# Patient Record
Sex: Male | Born: 1985
Health system: Southern US, Community
[De-identification: ages and names within clinical notes are randomized; demographics above are authoritative.]

## PROBLEM LIST (undated history)

## (undated) DIAGNOSIS — K649 Unspecified hemorrhoids: Secondary | ICD-10-CM

## (undated) DIAGNOSIS — K644 Residual hemorrhoidal skin tags: Secondary | ICD-10-CM

## (undated) DIAGNOSIS — Z8673 Personal history of transient ischemic attack (TIA), and cerebral infarction without residual deficits: Secondary | ICD-10-CM

## (undated) DIAGNOSIS — L659 Nonscarring hair loss, unspecified: Secondary | ICD-10-CM

## (undated) DIAGNOSIS — I10 Essential (primary) hypertension: Secondary | ICD-10-CM

## (undated) DIAGNOSIS — Z973 Presence of spectacles and contact lenses: Secondary | ICD-10-CM

## (undated) DIAGNOSIS — K219 Gastro-esophageal reflux disease without esophagitis: Secondary | ICD-10-CM

## (undated) HISTORY — PX: NO PAST SURGERIES: SHX2092

## (undated) HISTORY — DX: Essential (primary) hypertension: I10

## (undated) HISTORY — PX: HEMORROIDECTOMY: SUR656

## (undated) HISTORY — DX: Gastro-esophageal reflux disease without esophagitis: K21.9

---

## 2011-07-31 DIAGNOSIS — Z8673 Personal history of transient ischemic attack (TIA), and cerebral infarction without residual deficits: Secondary | ICD-10-CM

## 2011-07-31 HISTORY — DX: Personal history of transient ischemic attack (TIA), and cerebral infarction without residual deficits: Z86.73

## 2015-02-21 ENCOUNTER — Emergency Department (HOSPITAL_COMMUNITY)
Admission: EM | Admit: 2015-02-21 | Discharge: 2015-02-21 | Disposition: A | Discharge status: Home / Self Care | Payer: Self-pay | Attending: Emergency Medicine | Admitting: Emergency Medicine

## 2015-02-21 ENCOUNTER — Encounter (HOSPITAL_COMMUNITY): Payer: Self-pay

## 2015-02-21 DIAGNOSIS — Z8673 Personal history of transient ischemic attack (TIA), and cerebral infarction without residual deficits: Secondary | ICD-10-CM | POA: Insufficient documentation

## 2015-02-21 DIAGNOSIS — R198 Other specified symptoms and signs involving the digestive system and abdomen: Secondary | ICD-10-CM

## 2015-02-21 DIAGNOSIS — K6289 Other specified diseases of anus and rectum: Secondary | ICD-10-CM | POA: Insufficient documentation

## 2015-02-21 MED ORDER — CIPROFLOXACIN HCL 500 MG PO TABS
500.0000 mg | ORAL_TABLET | Freq: Once | ORAL | Status: AC
  Administered 2015-02-21: 500 mg via ORAL
  Filled 2015-02-21: qty 1

## 2015-02-21 MED ORDER — CIPROFLOXACIN HCL 500 MG PO TABS: 500.0000 mg | ORAL_TABLET | Freq: Two times a day (BID) | ORAL | Status: DC

## 2015-02-21 NOTE — ED Notes (Signed)
MD at bedside. 

## 2015-02-21 NOTE — ED Provider Notes (Signed)
CSN: [id number]     Arrival date & time 02/21/15  1133 History   First MD Initiated Contact with Patient 02/21/15 1208     Chief Complaint  Patient presents with  . Fever  . Cough  . Rectal Discharge      HPI Patient reports scant rectal discharge of the past 3-4 days.  He feels like this is somewhat improving.  He was seen [facility] the [facility] reports he had a full STD check including rectal swabs was told he had no HIV or syphilis.  He feels like his discharge is improving but has had low-grade fevers of the past several days.  Had a prostate infection his been similar to this before in the past.  He denies urinary symptoms at this time.  Denies nausea vomiting.  No abdominal pain.  No other complaints.  He does have rectal intercourse   Past Medical History  Diagnosis Date  . TIA (transient ischemic attack)   . Stroke    History reviewed. No pertinent past surgical history. History reviewed. No pertinent family history. History  Substance Use Topics  . Smoking status: Never Smoker   . Smokeless tobacco: Not on file  . Alcohol Use: Yes     Comment: rarely    Review of Systems  All other systems reviewed and are negative.     Allergies  Review of patient's allergies indicates not on file.  Home Medications   Prior to Admission medications   Medication Sig Start Date End Date Taking? Authorizing Provider  acetaminophen (TYLENOL) 500 MG tablet Take 500 mg by mouth every 6 (six) hours as needed for fever.   Yes Historical Provider, MD   BP 137/77 mmHg  Pulse 89  Temp(Src) 98.6 F (37 C) (Oral)  Resp 18  SpO2 99% Physical Exam  Constitutional: He is oriented to person, place, and time. He appears well-developed and well-nourished.  HENT:  Head: Normocephalic and atraumatic.  Eyes: EOM are normal.  Neck: Normal range of motion.  Cardiovascular: Normal rate, regular rhythm, normal heart sounds and intact distal pulses.   Pulmonary/Chest: Effort normal and  breath sounds normal. No respiratory distress.  Abdominal: Soft. He exhibits no distension. There is no tenderness.  Genitourinary:  Rectal examination demonstrates no gross blood.  No obvious rectal fissures.  Small amount of irritation consistent with dermatitis around his anus.  Mild tenderness of the prostate.  Musculoskeletal: Normal range of motion.  Neurological: He is alert and oriented to person, place, and time.  Skin: Skin is warm and dry.  Psychiatric: He has a normal mood and affect. Judgment normal.  Nursing note and vitals reviewed.   ED Course  Procedures (including critical care time) Labs Review Labs Reviewed - No data to display  Imaging Review No results found.   EKG Interpretation None      MDM   Final diagnoses:  None    This time the patient has only mild tenderness on rectal examination.  Patient be given ciprofloxacin at this time.  Spiriva present early prostatitis versus proctitis.  I'm not convinced the patient has a rectal abscess at this time.  Patient is overall well-appearing.  He is afebrile and nontoxic here.  His abdominal exam is benign.  Recommend outpatient follow-up with a gastroenterologist.  He will go home on Cipro.  He understands to return to the ER for new or worsening symptoms    Azalia Bilis, MD 02/21/15 1312

## 2015-02-21 NOTE — ED Notes (Signed)
Pt c/o rectal discharge and pain x 2.5 weeks and fever and cough x 2 weeks.  Pain only w/ bowel movements.  Discharge is clear and sometimes tainted w/ blood.  Pt reports taking Tylenol for fever.  Sts "[facility] and had a full workup, but was told that they could not give him any medications."

## 2016-11-16 ENCOUNTER — Encounter: Payer: Self-pay | Admitting: Family

## 2016-11-16 ENCOUNTER — Other Ambulatory Visit (INDEPENDENT_AMBULATORY_CARE_PROVIDER_SITE_OTHER): Payer: BLUE CROSS/BLUE SHIELD

## 2016-11-16 ENCOUNTER — Ambulatory Visit (INDEPENDENT_AMBULATORY_CARE_PROVIDER_SITE_OTHER): Payer: BLUE CROSS/BLUE SHIELD | Admitting: Family

## 2016-11-16 VITALS — BP 142/94 | HR 80 | Temp 99.1°F | Resp 16 | Ht 69.0 in | Wt 184.1 lb

## 2016-11-16 DIAGNOSIS — L659 Nonscarring hair loss, unspecified: Secondary | ICD-10-CM

## 2016-11-16 DIAGNOSIS — K644 Residual hemorrhoidal skin tags: Secondary | ICD-10-CM

## 2016-11-16 DIAGNOSIS — K219 Gastro-esophageal reflux disease without esophagitis: Secondary | ICD-10-CM | POA: Diagnosis not present

## 2016-11-16 LAB — COMPREHENSIVE METABOLIC PANEL
ALT: 17 U/L (ref 0–53)
AST: 20 U/L (ref 0–37)
Albumin: 4.7 g/dL (ref 3.5–5.2)
Alkaline Phosphatase: 65 U/L (ref 39–117)
BUN: 15 mg/dL (ref 6–23)
CO2: 29 mEq/L (ref 19–32)
Calcium: 9.5 mg/dL (ref 8.4–10.5)
Chloride: 101 mEq/L (ref 96–112)
Creatinine, Ser: 1.17 mg/dL (ref 0.40–1.50)
GFR: 77.87 mL/min (ref >60.00)
Glucose, Bld: 84 mg/dL (ref 70–99)
Potassium: 3.9 mEq/L (ref 3.5–5.1)
Sodium: 138 mEq/L (ref 135–145)
Total Bilirubin: 0.5 mg/dL (ref 0.2–1.2)
Total Protein: 7.4 g/dL (ref 6.0–8.3)

## 2016-11-16 LAB — TSH: TSH: 1.52 u[IU]/mL (ref 0.35–4.50)

## 2016-11-16 NOTE — Assessment & Plan Note (Signed)
Obtain complete metabolic profile and TSH. Has significant family history for hair loss in first and second-degree relatives. Will consider fat finasteride pending blood work results. Advised to continue with high protein intake and biotin supplementation as needed.

## 2016-11-16 NOTE — Assessment & Plan Note (Signed)
Residual hemorrhoidal skin tag located at the 9/10:00 position of the rectum with no evidence of thrombosis. Refer to general surgery per patient request to have it removed. Continue with good rectal hygiene and avoiding constipation. Continue over-the-counter medications as needed for symptom relief. Continue to monitor.

## 2016-11-16 NOTE — Patient Instructions (Addendum)
Thank you for choosing [facility].  SUMMARY AND INSTRUCTIONS:  Please monitor your blood pressure at home.  We will check your blood work to ensure there is not a metabolic cause for your hair loss.   Continue with good rectal hygiene and avoid constipation.   They will call with your referral to general surgery.  Labs:  Please stop by the lab on the lower level of the building for your blood work. Your results will be released to MyChart (or called to you) after review, usually within 72 hours after test completion. If any changes need to be made, you will be notified at that same time.  1.) The lab is open from 7:30am to 5:30 pm Monday-Friday 2.) No appointment is necessary 3.) Fasting (if needed) is 6-8 hours after food and drink; black coffee and water are okay    Referrals:  Referrals have been made during this visit. You should expect to hear back from our schedulers in about 7-10 days in regards to establishing an appointment with the specialists we discussed.   Follow up:  If your symptoms worsen or fail to improve, please contact our office for further instruction, or in case of emergency go directly to the emergency room at the closest medical facility.     Food Choices for Gastroesophageal Reflux Disease, Adult When you have gastroesophageal reflux disease (GERD), the foods you eat and your eating habits are very important. Choosing the right foods can help ease your discomfort. What guidelines do I need to follow?  Choose fruits, vegetables, whole grains, and low-fat dairy products.  Choose low-fat meat, fish, and poultry.  Limit fats such as oils, salad dressings, butter, nuts, and avocado.  Keep a food diary. This helps you identify foods that cause symptoms.  Avoid foods that cause symptoms. These may be different for everyone.  Eat small meals often instead of 3 large meals a day.  Eat your meals slowly, in a place where you are relaxed.  Limit  fried foods.  Cook foods using methods other than frying.  Avoid drinking alcohol.  Avoid drinking large amounts of liquids with your meals.  Avoid bending over or lying down until 2-3 hours after eating. What foods are not recommended? These are some foods and drinks that may make your symptoms worse: Vegetables  Tomatoes. Tomato juice. Tomato and spaghetti sauce. Chili peppers. Onion and garlic. Horseradish. Fruits  Oranges, grapefruit, and lemon (fruit and juice). Meats  High-fat meats, fish, and poultry. This includes hot dogs, ribs, ham, sausage, salami, and bacon. Dairy  Whole milk and chocolate milk. Sour cream. Cream. Butter. Ice cream. Cream cheese. Drinks  Coffee and tea. Bubbly (carbonated) drinks or energy drinks. Condiments  Hot sauce. Barbecue sauce. Sweets/Desserts  Chocolate and cocoa. Donuts. Peppermint and spearmint. Fats and Oils  High-fat foods. This includes [place] fries and potato chips. Other  Vinegar. Strong spices. This includes black pepper, white pepper, red pepper, cayenne, curry powder, cloves, ginger, and chili powder. The items listed above may not be a complete list of foods and drinks to avoid. Contact your dietitian for more information.  This information is not intended to replace advice given to you by your health care provider. Make sure you discuss any questions you have with your health care provider. Document Released: 01/15/2012 Document Revised: 12/22/2015 Document Reviewed: 05/20/2013 Elsevier Interactive Patient Education  2017 [employer].

## 2016-11-16 NOTE — Assessment & Plan Note (Signed)
Symptoms and exam are consistent with gastroesophageal reflux most likely associated with nutritional intake. Medications as needed for symptom relief and supportive care. Information on GERD-related diet provided and after visit summary. Follow-up if symptoms worsen or no longer controlled with over-the-counter medications.

## 2016-11-16 NOTE — Progress Notes (Signed)
Subjective:    Patient ID: Justin Burch, male    DOB: [birth year], 32 y.o.   MRN: [id number]  Chief Complaint  Patient presents with  . Establish Care    wants to talk about getting a rx for finasteride, heartburn and hemerroids    HPI:  Justin Burch is a 31 y.o. male who  has a past medical history of Chicken pox; GERD (gastroesophageal reflux disease); Stroke [facility]); and TIA (transient ischemic attack). and presents today for an office visit to establish care.  1.) GERD - Associated symptom of reflux has been going on for about 4 months and the timing of the symptoms is worse before he goes to bed. He has tried over the counter Prilosec which helped out significantly. Prior to the start of these episodes he had not had any symptoms. Denies any nausea, vomiting, diarrhea, or constipation. Does take ibuprofen about 2-3 times per month for headaches, but not regularly. He is supposed to take aspirin on a regular basis. Working on nutrition and physical activity. No previous medications attempted.   2.) Hemorrhoids - Associated symptom of a hemorrhoid has been going on for about 6 months.  Modifying factors include taking daily fiber supplements. At first there was some bleeding, but has not had symptoms in a while. Describes having a skin tag like feeling.  3.) Hair loss - Associated symptom of hair loss has been going on for about 5 years. Hair loss is gradual with a receeding hair line. Denies any modifying factors or attempted treatments. No work up has been done to this point. No urinary systems currently.  Does have strong family history with grandfather being bald and father have some hair with significant hair loss.   No Known Allergies    Outpatient Medications Prior to Visit  Medication Sig Dispense Refill  . acetaminophen (TYLENOL) 500 MG tablet Take 500 mg by mouth every 6 (six) hours as needed for fever.    . ciprofloxacin (CIPRO) 500 MG tablet Take 1 tablet (500 mg total)  by mouth every 12 (twelve) hours. 14 tablet 0   No facility-administered medications prior to visit.       History reviewed. No pertinent surgical history.    Past Medical History:  Diagnosis Date  . Chicken pox   . GERD (gastroesophageal reflux disease)   . Stroke ([place])   . TIA (transient ischemic attack)       Review of Systems  Constitutional: Negative for chills, fatigue and fever.  Respiratory: Negative for chest tightness and shortness of breath.   Cardiovascular: Negative for chest pain, palpitations and leg swelling.  Endocrine: Negative for cold intolerance, heat intolerance, polydipsia, polyphagia and polyuria.  Skin:       Positive for hair loss.       Objective:    BP (!) 142/94 (BP Location: Left Arm, Patient Position: Sitting, Cuff Size: Normal)   Pulse 80   Temp 99.1 F (37.3 C) (Oral)   Resp 16   Ht _0  (1.753 m)   Wt 184 lb 1.9 oz (83.5 kg)   SpO2 97%   BMI 27.19 kg/m  Nursing note and vital signs reviewed.  Physical Exam  Constitutional: He is oriented to person, place, and time. He appears well-developed and well-nourished. No distress.  Cardiovascular: Normal rate, regular rhythm, normal heart sounds and intact distal pulses.   Pulmonary/Chest: Effort normal and breath sounds normal.  Abdominal: Normal appearance and bowel sounds are normal. He exhibits no mass. There  is no hepatosplenomegaly. There is no tenderness. There is no rigidity, no rebound, no guarding, no tenderness at McBurney's point and negative Murphy's sign.  Genitourinary: Rectal exam shows no fissure, no mass and no tenderness.  Genitourinary Comments: External skin tag/former hemorrhoid located at the 9/10:00 position of the rectum.  Neurological: He is alert and oriented to person, place, and time.  Skin: Skin is warm and dry.  Psychiatric: He has a normal mood and affect. His behavior is normal. Judgment and thought content normal.       Assessment & Plan:   Problem  List Items Addressed This Visit      Digestive   Residual hemorrhoidal skin tags - Primary    Residual hemorrhoidal skin tag located at the 9/10:00 position of the rectum with no evidence of thrombosis. Refer to general surgery per patient request to have it removed. Continue with good rectal hygiene and avoiding constipation. Continue over-the-counter medications as needed for symptom relief. Continue to monitor.      Relevant Orders   Ambulatory referral to General Surgery   Gastroesophageal reflux disease without esophagitis    Symptoms and exam are consistent with gastroesophageal reflux most likely associated with nutritional intake. Medications as needed for symptom relief and supportive care. Information on GERD-related diet provided and after visit summary. Follow-up if symptoms worsen or no longer controlled with over-the-counter medications.        Other   Hair loss    Obtain complete metabolic profile and TSH. Has significant family history for hair loss in first and second-degree relatives. Will consider fat finasteride pending blood work results. Advised to continue with high protein intake and biotin supplementation as needed.      Relevant Orders   Comp Met (CMET)   TSH       I have discontinued [name] acetaminophen and ciprofloxacin.   Follow-up: Return in about 3 months (around 02/15/2017), or if symptoms worsen or fail to improve.  Mauricio Po, FNP

## 2016-11-18 MED ORDER — FINASTERIDE 1 MG PO TABS: 1.0000 mg | ORAL_TABLET | Freq: Every day | ORAL | 3 refills | Status: DC

## 2016-12-07 ENCOUNTER — Ambulatory Visit: Payer: Self-pay | Admitting: Surgery

## 2016-12-07 DIAGNOSIS — K644 Residual hemorrhoidal skin tags: Secondary | ICD-10-CM | POA: Diagnosis not present

## 2016-12-07 NOTE — H&P (Signed)
Surgical H&P  CC: anal skin tag  HPI: this is a 31 year old gentleman who presents with a anal skin tag.  He develot episode of hemorrhoid disease about 6 months ago.  At that time he had about a week of pain which subsequently subsided.  He has not had any bleeding.  No pruritus or pain presently.  Since that event he continues to notice the skin tag in the perianal region which is irritating to him.  He desires removal.  No prior rectal or perianal procedures.  No prior colonoscopy.  He does have rectal intercourse per chart review. On chart review it appears as though he's had some episodes of prostatitis. He does not have any issues with constipation.  He takes a fiber supplement daily.  Mild amount of straining but does not spend longer than 10 minutes on the toilet.   The TIA noted in his history occurred about 6 years ago and has never recurred since.  There was no etiology discovered.  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  No Known Allergies  Past Medical History:  Diagnosis Date  . Chicken pox   . GERD (gastroesophageal reflux disease)   . Stroke ([facility])   . TIA (transient ischemic attack)     No past surgical history on file.  Family History  Problem Relation Age of Onset  . Hypertension Mother   . Stroke Mother   . Ovarian cancer Mother   . Kidney disease Mother   . Healthy Father   . Lung cancer Maternal Grandmother   . Healthy Paternal Grandmother   . Colon cancer Paternal Grandfather     Social History   Social History  . Marital status: Single    Spouse name: N/A  . Number of children: 0  . Years of education: 12   Occupational History  . Hospitality     Social History Main Topics  . Smoking status: Never Smoker  . Smokeless tobacco: Never Used  . Alcohol use Yes     Comment: 1-2 times per month  . Drug use: Yes    Types: Marijuana     Comment: 3x per year  . Sexual activity: Not on file   Other Topics Concern  .  Not on file   Social History Narrative   Fun/Hobby: Video games, read    Current Outpatient Prescriptions on File Prior to Visit  Medication Sig Dispense Refill  . finasteride (PROPECIA) 1 MG tablet Take 1 tablet (1 mg total) by mouth daily. 30 tablet 3   No current facility-administered medications on file prior to visit.     Review of Systems: a complete, 10pt review of systems was completed with pertinent positives and negatives as documented in the HPI.   Physical Exam: There were no vitals filed for this visit. Gen: A&Ox3, no distress  Head: normocephalic, atraumatic, EOMI, anicteric.  Neck: supple without mass or thyromegaly Chest: unlabored respirations symmetrical air entry  Cardiovascular: RRR with palpable distal pulses Abdomen: soft, nontender nondistended.  No mass or organomegaly Extremities: warm, without edema, no deformities  Neuro: grossly intact Psych: appropriate mood and affect, normal insight Skin: no lesions or rashes on limited skin exam Rectum: there is a 1 severe by 5 mm pedunculated skin tag in the left posterior position.  Just anterior to this there is a very small additional skin tag.  No external hemorrhoidal disease otherwise.  On digital exam there is no mass or  blood.  No tenderness.  No flowsheet data found.  CMP Latest Ref Rng & Units 11/16/2016  Glucose 70 - 99 mg/dL 84  BUN 6 - 23 mg/dL 15  Creatinine 8.46 - 9.62 mg/dL 9.52  Sodium 841 - 324 mEq/L 138  Potassium 3.5 - 5.1 mEq/L 3.9  Chloride 96 - 112 mEq/L 101  CO2 19 - 32 mEq/L 29  Calcium 8.4 - 10.5 mg/dL 9.5  Total Protein 6.0 - 8.3 g/dL 7.4  Total Bilirubin 0.2 - 1.2 mg/dL 0.5  Alkaline Phos 39 - 117 U/L 65  AST 0 - 37 U/L 20  ALT 0 - 53 U/L 17    No results found for: INR, PROTIME  Imaging: n/a  A/P: 31 year old gentleman with a symptomatic anal skin tag.  He desires excision.  I discussed with him the nature of the surgery including the risks of bleeding, infection, pain,  scarring, very unlikely sphincter injury incontinence, urinary retention, and recurrence of hemorrhoidal disease. I advised him S of the importance of good bowel regimen including drinking 64 ounces of water a day in addition to the fibers along that he is already taking.  With the goal of having 1 soft easy bowel movement daily without straining and leinutes on the toilet.  We discussed the need to do sitz baths.  Discussed the anticipated postop recovery time and restrictions.   Phylliss Blakes, MD [facility], [place] Pager [phone]

## 2017-01-10 ENCOUNTER — Encounter (HOSPITAL_BASED_OUTPATIENT_CLINIC_OR_DEPARTMENT_OTHER): Payer: Self-pay | Admitting: *Deleted

## 2017-01-10 NOTE — Progress Notes (Signed)
NPO AFTER MN.  ARRIVE AT 0930.  NEEDS HG.  WILL TAKE PRILOSEC AM DOS W/ SIPS OF WATER.

## 2017-01-18 ENCOUNTER — Ambulatory Visit (HOSPITAL_BASED_OUTPATIENT_CLINIC_OR_DEPARTMENT_OTHER): Payer: BLUE CROSS/BLUE SHIELD | Admitting: Anesthesiology

## 2017-01-18 ENCOUNTER — Encounter (HOSPITAL_BASED_OUTPATIENT_CLINIC_OR_DEPARTMENT_OTHER)
Admission: RE | Disposition: A | Discharge status: Home / Self Care | Payer: Self-pay | Source: Ambulatory Visit | Attending: Surgery

## 2017-01-18 ENCOUNTER — Ambulatory Visit (HOSPITAL_BASED_OUTPATIENT_CLINIC_OR_DEPARTMENT_OTHER)
Admission: RE | Admit: 2017-01-18 | Discharge: 2017-01-18 | Disposition: A | Discharge status: Home / Self Care | Payer: BLUE CROSS/BLUE SHIELD | Source: Ambulatory Visit | Attending: Surgery | Admitting: Surgery

## 2017-01-18 ENCOUNTER — Encounter (HOSPITAL_BASED_OUTPATIENT_CLINIC_OR_DEPARTMENT_OTHER): Payer: Self-pay

## 2017-01-18 DIAGNOSIS — K219 Gastro-esophageal reflux disease without esophagitis: Secondary | ICD-10-CM | POA: Insufficient documentation

## 2017-01-18 DIAGNOSIS — Z801 Family history of malignant neoplasm of trachea, bronchus and lung: Secondary | ICD-10-CM | POA: Diagnosis not present

## 2017-01-18 DIAGNOSIS — Z800 Family history of malignant neoplasm of digestive organs: Secondary | ICD-10-CM | POA: Insufficient documentation

## 2017-01-18 DIAGNOSIS — Z8041 Family history of malignant neoplasm of ovary: Secondary | ICD-10-CM | POA: Diagnosis not present

## 2017-01-18 DIAGNOSIS — Z8673 Personal history of transient ischemic attack (TIA), and cerebral infarction without residual deficits: Secondary | ICD-10-CM | POA: Insufficient documentation

## 2017-01-18 DIAGNOSIS — Z823 Family history of stroke: Secondary | ICD-10-CM | POA: Insufficient documentation

## 2017-01-18 DIAGNOSIS — K644 Residual hemorrhoidal skin tags: Secondary | ICD-10-CM | POA: Diagnosis not present

## 2017-01-18 DIAGNOSIS — Z8249 Family history of ischemic heart disease and other diseases of the circulatory system: Secondary | ICD-10-CM | POA: Insufficient documentation

## 2017-01-18 DIAGNOSIS — Z79899 Other long term (current) drug therapy: Secondary | ICD-10-CM | POA: Diagnosis not present

## 2017-01-18 DIAGNOSIS — K210 Gastro-esophageal reflux disease with esophagitis: Secondary | ICD-10-CM | POA: Diagnosis not present

## 2017-01-18 HISTORY — PX: EXCISION OF SKIN TAG: SHX6270

## 2017-01-18 HISTORY — DX: Residual hemorrhoidal skin tags: K64.4

## 2017-01-18 HISTORY — DX: Presence of spectacles and contact lenses: Z97.3

## 2017-01-18 HISTORY — DX: Nonscarring hair loss, unspecified: L65.9

## 2017-01-18 HISTORY — DX: Unspecified hemorrhoids: K64.9

## 2017-01-18 HISTORY — DX: Personal history of transient ischemic attack (TIA), and cerebral infarction without residual deficits: Z86.73

## 2017-01-18 LAB — POCT HEMOGLOBIN-HEMACUE: Hemoglobin: 16.2 g/dL (ref 13.0–17.0)

## 2017-01-18 SURGERY — EXCISION, SKIN TAG
Anesthesia: Monitor Anesthesia Care | Site: Rectum

## 2017-01-18 MED ORDER — MEPERIDINE HCL 25 MG/ML IJ SOLN
6.2500 mg | INTRAMUSCULAR | Status: DC | PRN
  Filled 2017-01-18: qty 1

## 2017-01-18 MED ORDER — PROPOFOL 10 MG/ML IV BOLUS
INTRAVENOUS | Status: DC | PRN
Start: 2017-01-18 — End: 2017-01-18
  Administered 2017-01-18: 50 mg via INTRAVENOUS

## 2017-01-18 MED ORDER — ONDANSETRON HCL 4 MG/2ML IJ SOLN
INTRAMUSCULAR | Status: AC
  Filled 2017-01-18: qty 2

## 2017-01-18 MED ORDER — FENTANYL CITRATE (PF) 100 MCG/2ML IJ SOLN
INTRAMUSCULAR | Status: AC
  Filled 2017-01-18: qty 2

## 2017-01-18 MED ORDER — MIDAZOLAM HCL 5 MG/5ML IJ SOLN
INTRAMUSCULAR | Status: DC | PRN
  Administered 2017-01-18: 1 mg via INTRAVENOUS
  Administered 2017-01-18: 2 mg via INTRAVENOUS

## 2017-01-18 MED ORDER — CELECOXIB 400 MG PO CAPS
400.0000 mg | ORAL_CAPSULE | ORAL | Status: AC
  Administered 2017-01-18: 400 mg via ORAL
  Filled 2017-01-18: qty 1

## 2017-01-18 MED ORDER — ACETAMINOPHEN 650 MG RE SUPP
650.0000 mg | RECTAL | Status: DC | PRN
  Filled 2017-01-18: qty 1

## 2017-01-18 MED ORDER — FLEET ENEMA 7-19 GM/118ML RE ENEM
1.0000 | ENEMA | Freq: Once | RECTAL | Status: AC
  Administered 2017-01-18: 1 via RECTAL
  Filled 2017-01-18: qty 1

## 2017-01-18 MED ORDER — GABAPENTIN 300 MG PO CAPS
300.0000 mg | ORAL_CAPSULE | ORAL | Status: AC
  Administered 2017-01-18: 300 mg via ORAL
  Filled 2017-01-18: qty 1

## 2017-01-18 MED ORDER — ACETAMINOPHEN 500 MG PO TABS
1000.0000 mg | ORAL_TABLET | ORAL | Status: AC
  Administered 2017-01-18: 1000 mg via ORAL
  Filled 2017-01-18: qty 2

## 2017-01-18 MED ORDER — CEFAZOLIN SODIUM-DEXTROSE 2-4 GM/100ML-% IV SOLN
2.0000 g | INTRAVENOUS | Status: AC
  Administered 2017-01-18: 2 g via INTRAVENOUS
  Filled 2017-01-18: qty 100

## 2017-01-18 MED ORDER — SODIUM CHLORIDE 0.9% FLUSH
3.0000 mL | INTRAVENOUS | Status: DC | PRN
  Filled 2017-01-18: qty 3

## 2017-01-18 MED ORDER — ONDANSETRON HCL 4 MG/2ML IJ SOLN
INTRAMUSCULAR | Status: DC | PRN
  Administered 2017-01-18: 4 mg via INTRAVENOUS

## 2017-01-18 MED ORDER — CHLORHEXIDINE GLUCONATE 4 % EX LIQD
60.0000 mL | Freq: Once | CUTANEOUS | Status: DC
  Filled 2017-01-18: qty 118

## 2017-01-18 MED ORDER — MIDAZOLAM HCL 2 MG/2ML IJ SOLN
INTRAMUSCULAR | Status: AC
  Filled 2017-01-18: qty 2

## 2017-01-18 MED ORDER — SODIUM CHLORIDE 0.9% FLUSH
3.0000 mL | Freq: Two times a day (BID) | INTRAVENOUS | Status: DC
  Filled 2017-01-18: qty 3

## 2017-01-18 MED ORDER — PROMETHAZINE HCL 25 MG/ML IJ SOLN
6.2500 mg | INTRAMUSCULAR | Status: DC | PRN
  Filled 2017-01-18: qty 1

## 2017-01-18 MED ORDER — LIDOCAINE HCL (CARDIAC) 20 MG/ML IV SOLN
INTRAVENOUS | Status: DC | PRN
  Administered 2017-01-18: 60 mg via INTRAVENOUS

## 2017-01-18 MED ORDER — DEXAMETHASONE SODIUM PHOSPHATE 4 MG/ML IJ SOLN
INTRAMUSCULAR | Status: DC | PRN
  Administered 2017-01-18: 10 mg via INTRAVENOUS

## 2017-01-18 MED ORDER — OXYCODONE HCL 5 MG PO TABS
5.0000 mg | ORAL_TABLET | ORAL | Status: DC | PRN
  Filled 2017-01-18: qty 2

## 2017-01-18 MED ORDER — LIDOCAINE 2% (20 MG/ML) 5 ML SYRINGE
INTRAMUSCULAR | Status: AC
  Filled 2017-01-18: qty 5

## 2017-01-18 MED ORDER — PROPOFOL 10 MG/ML IV BOLUS
INTRAVENOUS | Status: AC
  Filled 2017-01-18: qty 40

## 2017-01-18 MED ORDER — LACTATED RINGERS IV SOLN
INTRAVENOUS | Status: DC
  Administered 2017-01-18: 10:00:00 via INTRAVENOUS
  Filled 2017-01-18: qty 1000

## 2017-01-18 MED ORDER — MIDAZOLAM HCL 2 MG/2ML IJ SOLN
0.5000 mg | Freq: Once | INTRAMUSCULAR | Status: DC | PRN
  Filled 2017-01-18: qty 2

## 2017-01-18 MED ORDER — FENTANYL CITRATE (PF) 100 MCG/2ML IJ SOLN
INTRAMUSCULAR | Status: DC | PRN
  Administered 2017-01-18: 25 ug via INTRAVENOUS

## 2017-01-18 MED ORDER — DOCUSATE SODIUM 100 MG PO CAPS: 100.0000 mg | ORAL_CAPSULE | Freq: Two times a day (BID) | ORAL | 0 refills | Status: AC

## 2017-01-18 MED ORDER — ACETAMINOPHEN 500 MG PO TABS
ORAL_TABLET | ORAL | Status: AC
  Filled 2017-01-18: qty 2

## 2017-01-18 MED ORDER — CELECOXIB 200 MG PO CAPS
ORAL_CAPSULE | ORAL | Status: AC
  Filled 2017-01-18: qty 2

## 2017-01-18 MED ORDER — SODIUM CHLORIDE 0.9 % IV SOLN
250.0000 mL | INTRAVENOUS | Status: DC | PRN
  Filled 2017-01-18: qty 250

## 2017-01-18 MED ORDER — CEFAZOLIN SODIUM-DEXTROSE 2-4 GM/100ML-% IV SOLN
INTRAVENOUS | Status: AC
  Filled 2017-01-18: qty 100

## 2017-01-18 MED ORDER — LIDOCAINE-EPINEPHRINE 1 %-1:100000 IJ SOLN
INTRAMUSCULAR | Status: DC | PRN
  Administered 2017-01-18: 20 mL

## 2017-01-18 MED ORDER — PROPOFOL 500 MG/50ML IV EMUL
INTRAVENOUS | Status: DC | PRN
  Administered 2017-01-18: 150 ug/kg/min via INTRAVENOUS

## 2017-01-18 MED ORDER — FENTANYL CITRATE (PF) 100 MCG/2ML IJ SOLN
25.0000 ug | INTRAMUSCULAR | Status: DC | PRN
  Filled 2017-01-18: qty 1

## 2017-01-18 MED ORDER — HYDROCODONE-ACETAMINOPHEN 5-325 MG PO TABS: 1.0000 | ORAL_TABLET | Freq: Four times a day (QID) | ORAL | 0 refills | Status: DC | PRN

## 2017-01-18 MED ORDER — GABAPENTIN 300 MG PO CAPS
ORAL_CAPSULE | ORAL | Status: AC
  Filled 2017-01-18: qty 1

## 2017-01-18 MED ORDER — ACETAMINOPHEN 325 MG PO TABS
650.0000 mg | ORAL_TABLET | ORAL | Status: DC | PRN
  Filled 2017-01-18: qty 2

## 2017-01-18 MED ORDER — DEXAMETHASONE SODIUM PHOSPHATE 10 MG/ML IJ SOLN
INTRAMUSCULAR | Status: AC
  Filled 2017-01-18: qty 1

## 2017-01-18 SURGICAL SUPPLY — 38 items
BLADE HEX COATED 2.75 (ELECTRODE) IMPLANT
BLADE SURG 15 STRL LF DISP TIS (BLADE) ×1 IMPLANT
BLADE SURG 15 STRL SS (BLADE) ×1
BNDG GAUZE ELAST 4 BULKY (GAUZE/BANDAGES/DRESSINGS) ×2 IMPLANT
BRIEF STRETCH FOR OB PAD LRG (UNDERPADS AND DIAPERS) ×2 IMPLANT
COVER BACK TABLE 60X90IN (DRAPES) ×2 IMPLANT
COVER MAYO STAND STRL (DRAPES) IMPLANT
DECANTER SPIKE VIAL GLASS SM (MISCELLANEOUS) IMPLANT
DRAPE HYSTEROSCOPY (DRAPE) IMPLANT
DRAPE LAPAROTOMY 100X72 PEDS (DRAPES) IMPLANT
DRAPE LG THREE QUARTER DISP (DRAPES) IMPLANT
GAUZE SPONGE 4X4 12PLY STRL (GAUZE/BANDAGES/DRESSINGS) IMPLANT
GAUZE SPONGE 4X4 16PLY XRAY LF (GAUZE/BANDAGES/DRESSINGS) IMPLANT
GLOVE BIO SURGEON STRL SZ 6 (GLOVE) ×4 IMPLANT
GLOVE BIOGEL PI IND STRL 6.5 (GLOVE) ×1 IMPLANT
GLOVE BIOGEL PI IND STRL 7.0 (GLOVE) ×1 IMPLANT
GLOVE BIOGEL PI INDICATOR 6.5 (GLOVE) ×1
GLOVE BIOGEL PI INDICATOR 7.0 (GLOVE) ×1
GLOVE INDICATOR 6.5 STRL GRN (GLOVE) ×4 IMPLANT
GOWN STRL REUS W/ TWL XL LVL3 (GOWN DISPOSABLE) ×2 IMPLANT
GOWN STRL REUS W/TWL 2XL LVL3 (GOWN DISPOSABLE) IMPLANT
GOWN STRL REUS W/TWL LRG LVL3 (GOWN DISPOSABLE) ×2 IMPLANT
GOWN STRL REUS W/TWL XL LVL3 (GOWN DISPOSABLE) ×2
LEGGING LITHOTOMY PAIR STRL (DRAPES) IMPLANT
NEEDLE HYPO 22GX1.5 SAFETY (NEEDLE) ×2 IMPLANT
PACK BASIN DAY SURGERY FS (CUSTOM PROCEDURE TRAY) ×2 IMPLANT
PENCIL BUTTON HOLSTER BLD 10FT (ELECTRODE) ×2 IMPLANT
SHEARS HARMONIC 9CM CVD (BLADE) IMPLANT
SPONGE LAP 18X18 X RAY DECT (DISPOSABLE) IMPLANT
SPONGE SURGIFOAM ABS GEL 100 (HEMOSTASIS) ×2 IMPLANT
SUT CHROMIC 3 0 SH 27 (SUTURE) IMPLANT
SUT VIC AB 3-0 SH 27 (SUTURE)
SUT VIC AB 3-0 SH 27X BRD (SUTURE) IMPLANT
SYR BULB IRRIGATION 50ML (SYRINGE) IMPLANT
SYR CONTROL 10ML LL (SYRINGE) ×2 IMPLANT
TOWEL OR 17X24 6PK STRL BLUE (TOWEL DISPOSABLE) ×2 IMPLANT
TRAY DSU PREP LF (CUSTOM PROCEDURE TRAY) ×2 IMPLANT
YANKAUER SUCT BULB TIP 10FT TU (MISCELLANEOUS) ×2 IMPLANT

## 2017-01-18 NOTE — Discharge Instructions (Signed)
ANORECTAL SURGERY: POST OP INSTRUCTIONS 1. Take your usually prescribed home medications unless otherwise directed. 2. DIET: During the first few hours after surgery sip on some liquids until you are able to urinate.  It is normal to not urinate for several hours after this surgery.  If you feel uncomfortable, please contact the office for instructions.  After you are able to urinate,you may eat, if you feel like it.  Follow a light bland diet the first 24 hours after arrival home, such as soup, liquids, crackers, etc.  Be sure to include lots of fluids daily (6-8 glasses).  Avoid fast food or heavy meals, as your are more likely to get nauseated.  Eat a low fat diet the next few days after surgery.  Limit caffeine intake to 1-2 servings a day. 3. PAIN CONTROL: a. Pain is best controlled by a usual combination of several different methods TOGETHER: i. Muscle relaxation 1.  Soak in a warm bath (or Sitz bath) three times a day and after bowel movements.  Continue to do this until all pain is resolved. ii. Over the counter pain medication iii. Prescription pain medication b. Most patients will experience some swelling and discomfort in the anus/rectal area and incisions.  Heat such as warm towels, sitz baths, warm baths, etc to help relax tight/sore spots and speed recovery.  Some people prefer to use ice, especially in the first couple days after surgery, as it may decrease the pain and swelling, or alternate between ice & heat.  Experiment to what works for you.  Swelling and bruising can take several weeks to resolve.  Pain can take even longer to completely resolve. c. It is helpful to take an over-the-counter pain medication regularly for the first few weeks.  Choose one of the following that works best for you: i. Naproxen (Aleve, etc)  Two 220mg tabs twice a day ii. Ibuprofen (Advil, etc) Three 200mg tabs four times a day (every meal & bedtime) d. A  prescription for pain medication (such as  percocet, oxycodone, hydrocodone, etc) should be given to you upon discharge.  Take your pain medication as prescribed.  i. If you are having problems/concerns with the prescription medicine (does not control pain, nausea, vomiting, rash, itching, etc), please call us [phone] to see if we need to switch you to a different pain medicine that will work better for you and/or control your side effect better. ii. If you need a refill on your pain medication, please contact your pharmacy.  They will contact our office to request authorization. Prescriptions will not be filled after 5 pm or on week-ends. 4. KEEP YOUR BOWELS REGULAR and AVOID CONSTIPATION a. The goal is one to two soft bowel movements a day.  You should at least have a bowel movement every other day. b. Avoid getting constipated.  Between the surgery and the pain medications, it is common to experience some constipation. This can be very painful after rectal surgery.  Increasing fluid intake and taking a fiber supplement (such as Metamucil, Citrucel, FiberCon, etc) 1-2 times a day regularly will usually help prevent this problem from occurring.  A stool softener like colace is also recommended.  This can be purchased over the counter at your pharmacy.  You can take it up to 3 times a day.  If you do not have a bowel movement after 24 hrs since your surgery, take one does of milk of magnesia.  If you still haven't had a bowel movement 8-12   hours after that dose, take another dose.  If you don't have a bowel movement 48 hrs after surgery, purchase a Fleets enema from the drug store and administer gently per package instructions.  If you still are having trouble with your bowel movements after that, please call the office for further instructions. c. If you develop diarrhea or have many loose bowel movements, simplify your diet to bland foods & liquids for a few days.  Stop any stool softeners and decrease your fiber supplement.  Switching to mild  anti-diarrheal medications (Kayopectate, Pepto Bismol) can help.  If this worsens or does not improve, please call us.  5. Wound Care a. Remove your bandages before your first bowel movement or 8 hours after surgery.     b. Remove any wound packing material at this tim,e as well.  You do not need to repack the wound unless instructed otherwise.  Wear an absorbent pad or soft cotton gauze in your underwear to catch any drainage and help keep the area clean. You should change this every 2-3 hours while awake. c. Keep the area clean and dry.  Bathe / shower every day, especially after bowel movements.  Keep the area clean by showering / bathing over the incision / wound.   It is okay to soak an open wound to help wash it.  Wet wipes or showers / gentle washing after bowel movements is often less traumatic than regular toilet paper. d. You may have some styrofoam-like soft packing in the rectum which will come out with the first bowel movement.  e. You will often notice bleeding with bowel movements.  This should slow down by the end of the first week of surgery f. Expect some drainage.  This should slow down, too, by the end of the first week of surgery.  Wear an absorbent pad or soft cotton gauze in your underwear until the drainage stops. g. Do Not sit on a rubber or pillow ring.  This can make you symptoms worse.  You may sit on a soft pillow if needed.  6. ACTIVITIES as tolerated:   a. You may resume regular (light) daily activities beginning the next day--such as daily self-care, walking, climbing stairs--gradually increasing activities as tolerated.  If you can walk 30 minutes without difficulty, it is safe to try more intense activity such as jogging, treadmill, bicycling, low-impact aerobics, swimming, etc. b. Save the most intensive and strenuous activity for last such as sit-ups, heavy lifting, contact sports, etc  Refrain from any heavy lifting or straining until you are off narcotics for pain  control.   c. You may drive when you are no longer taking prescription pain medication, you can comfortably sit for long periods of time, and you can safely maneuver your car and apply brakes. d. You may have sexual intercourse when it is comfortable.  7. FOLLOW UP in our office a. Please call [facility] at [phone] to set up an appointment to see your surgeon in the office for a follow-up appointment approximately 3-4 weeks after your surgery. b. Make sure that you call for this appointment the day you arrive home to insure a convenient appointment time. 10. IF YOU HAVE DISABILITY OR FAMILY LEAVE FORMS, BRING THEM TO THE OFFICE FOR PROCESSING.  DO NOT GIVE THEM TO YOUR DOCTOR.     WHEN TO CALL US [phone]: 1. Poor pain control 2. Reactions / problems with new medications (rash/itching, nausea, etc)  3. Fever over 101.5 F (38.5   C) 4. Inability to urinate 5. Nausea and/or vomiting 6. Worsening swelling or bruising 7. Continued bleeding from incision. 8. Increased pain, redness, or drainage from the incision  The clinic staff is available to answer your questions during regular business hours (8:30am-5pm).  Please don't hesitate to call and ask to speak to one of our nurses for clinical concerns.   A surgeon from [facility] is always on call at the hospitals   If you have a medical emergency, go to the nearest emergency room or call 911.    [facility] [address], [address], [place], [place]  [address] ? MAIN: [phone] ? TOLL FREE: [phone] ? FAX [phone] [email or web address]    Post Anesthesia Home Care Instructions  Activity: Get plenty of rest for the remainder of the day. A responsible individual must stay with you for 24 hours following the procedure.  For the next 24 hours, DO NOT: -Drive a car -Operate machinery -Drink alcoholic beverages -Take any medication unless instructed by your  physician -Make any legal decisions or sign important papers.  Meals: Start with liquid foods such as gelatin or soup. Progress to regular foods as tolerated. Avoid greasy, spicy, heavy foods. If nausea and/or vomiting occur, drink only clear liquids until the nausea and/or vomiting subsides. Call your physician if vomiting continues.  Special Instructions/Symptoms: Your throat may feel dry or sore from the anesthesia or the breathing tube placed in your throat during surgery. If this causes discomfort, gargle with warm salt water. The discomfort should disappear within 24 hours.  If you had a scopolamine patch placed behind your ear for the management of post- operative nausea and/or vomiting:  1. The medication in the patch is effective for 72 hours, after which it should be removed.  Wrap patch in a tissue and discard in the trash. Wash hands thoroughly with soap and water. 2. You may remove the patch earlier than 72 hours if you experience unpleasant side effects which may include dry mouth, dizziness or visual disturbances. 3. Avoid touching the patch. Wash your hands with soap and water after contact with the patch.        

## 2017-01-18 NOTE — Transfer of Care (Signed)
Immediate Anesthesia Transfer of Care Note  Immediate Anesthesia Transfer of Care Note  Patient: Justin Burch  Procedure(s) Performed: Procedure(s) (LRB): EXCISION ANAL SKIN TAG, RECTAL EXAM UNDER ANESTHESIA (N/A)  Patient Location: PACU  Anesthesia Type: General  Level of Consciousness: awake, alert  and oriented  Airway & Oxygen Therapy: Patient Spontanous Breathing and Patient connected to nasal cannua oxygen  Post-op Assessment: Report given to PACU RN and Post -op Vital signs reviewed and stable  Post vital signs: Reviewed and stable  Complications: No apparent anesthesia complications  Last Vitals:  Vitals:   01/18/17 0832 01/18/17 1152  BP: 129/86 (!) 148/78  Pulse: 73 86  Resp: 16 12  Temp: 36.9 C 36.4 C    Last Pain:  Vitals:   01/18/17 0832  TempSrc: Oral      Patients Stated Pain Goal: 5 (01/18/17 0937)  C

## 2017-01-18 NOTE — Anesthesia Preprocedure Evaluation (Addendum)
Anesthesia Evaluation  Patient identified by MRN, date of birth, ID band Patient awake    Reviewed: Allergy & Precautions, NPO status , Patient's Chart, lab work & pertinent test results  History of Anesthesia Complications Negative for: history of anesthetic complications  Airway Mallampati: II  TM Distance: >3 FB Neck ROM: Full    Dental  (+) Poor Dentition, Missing, Dental Advisory Given   Pulmonary neg pulmonary ROS,    breath sounds clear to auscultation       Cardiovascular negative cardio ROS   Rhythm:Regular Rate:Normal     Neuro/Psych negative neurological ROS     GI/Hepatic Neg liver ROS, GERD  Medicated and Controlled,  Endo/Other  negative endocrine ROS  Renal/GU negative Renal ROS     Musculoskeletal   Abdominal   Peds  Hematology negative hematology ROS (+)   Anesthesia Other Findings   Reproductive/Obstetrics                             Anesthesia Physical Anesthesia Plan  ASA: II  Anesthesia Plan: MAC   Post-op Pain Management:    Induction: Intravenous  PONV Risk Score and Plan: 1 and Ondansetron and Dexamethasone  Airway Management Planned: Natural Airway and Nasal Cannula  Additional Equipment:   Intra-op Plan:   Post-operative Plan:   Informed Consent: I have reviewed the patients History and Physical, chart, labs and discussed the procedure including the risks, benefits and alternatives for the proposed anesthesia with the patient or authorized representative who has indicated his/her understanding and acceptance.   Dental advisory given  Plan Discussed with: CRNA and Surgeon  Anesthesia Plan Comments: (Plan routine monitors, MAC with local by Dr Kae Heller)      Anesthesia Quick Evaluation

## 2017-01-18 NOTE — H&P (Signed)
Surgical H&P  CC: anal skin tag  HPI: this is a 31 year old gentleman who presents with a anal skin tag.  He develot episode of hemorrhoid disease about 6 months ago.  At that time he had about a week of pain which subsequently subsided.  He has not had any bleeding.  No pruritus or pain presently.  Since that event he continues to notice the skin tag in the perianal region which is irritating to him.  He desires removal.  No prior rectal or perianal procedures.  No prior colonoscopy.  He does have rectal intercourse per chart review. On chart review it appears as though he's had some episodes of prostatitis. He does not have any issues with constipation.  He takes a fiber supplement daily.  Mild amount of straining but does not spend longer than 10 minutes on the toilet.   The TIA noted in his history occurred about 6 years ago and has never recurred since.  There was no etiology discovered.  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  No Known Allergies      Past Medical History:  Diagnosis Date  . Chicken pox   . GERD (gastroesophageal reflux disease)   . Stroke (HCC)   . TIA (transient ischemic attack)     No past surgical history on file.       Family History  Problem Relation Age of Onset  . Hypertension Mother   . Stroke Mother   . Ovarian cancer Mother   . Kidney disease Mother   . Healthy Father   . Lung cancer Maternal Grandmother   . Healthy Paternal Grandmother   . Colon cancer Paternal Grandfather     Social History   Social History  . Marital status: Single    Spouse name: N/A  . Number of children: 0  . Years of education: 12       Occupational History  . Hospitality           Social History Main Topics  . Smoking status: Never Smoker  . Smokeless tobacco: Never Used  . Alcohol use Yes     Comment: 1-2 times per month  . Drug use: Yes    Types: Marijuana     Comment: 3x per year   . Sexual activity: Not on file       Other Topics Concern  . Not on file      Social History Narrative   Fun/Hobby: Video games, read          Current Outpatient Prescriptions on File Prior to Visit  Medication Sig Dispense Refill  . finasteride (PROPECIA) 1 MG tablet Take 1 tablet (1 mg total) by mouth daily. 30 tablet 3   No current facility-administered medications on file prior to visit.     Review of Systems: a complete, 10pt review of systems was completed with pertinent positives and negatives as documented in the HPI.   Physical Exam: There were no vitals filed for this visit. Gen: A&Ox3, no distress  Head: normocephalic, atraumatic, EOMI, anicteric.  Neck: supple without mass or thyromegaly Chest: unlabored respirations symmetrical air entry  Cardiovascular: RRR with palpable distal pulses Abdomen: soft, nontender nondistended.  No mass or organomegaly Extremities: warm, without edema, no deformities  Neuro: grossly intact Psych: appropriate mood and affect, normal insight Skin: no lesions or rashes on limited skin exam Rectum: there is a 1 cm by 5 mm pedunculated skin tag in  the left posterior position.  Just anterior to this there is a very small additional skin tag.  No external hemorrhoidal disease otherwise.  On digital exam there is no mass or blood.  No tenderness.  No flowsheet data found.  CMP Latest Ref Rng & Units 11/16/2016  Glucose 70 - 99 mg/dL 84  BUN 6 - 23 mg/dL 15  Creatinine 1.61 - 0.96 mg/dL 0.45  Sodium 409 - 811 mEq/L 138  Potassium 3.5 - 5.1 mEq/L 3.9  Chloride 96 - 112 mEq/L 101  CO2 19 - 32 mEq/L 29  Calcium 8.4 - 10.5 mg/dL 9.5  Total Protein 6.0 - 8.3 g/dL 7.4  Total Bilirubin 0.2 - 1.2 mg/dL 0.5  Alkaline Phos 39 - 117 U/L 65  AST 0 - 37 U/L 20  ALT 0 - 53 U/L 17    RecentLabs  No results found for: INR, PROTIME    Imaging: n/a  A/P: 31 year old gentleman with a symptomatic anal skin tag.  He desires  excision.  I discussed with him the nature of the surgery including the risks of bleeding, infection, pain, scarring, very unlikely sphincter injury incontinence, urinary retention, and recurrence of hemorrhoidal disease. I advised him S of the importance of good bowel regimen including drinking 64 ounces of water a day in addition to the fibers along that he is already taking.  With the goal of having 1 soft easy bowel movement daily without straining and less than on the toilet.  We discussed the need to do sitz baths.  Discussed the anticipated postop recovery time and restrictions.   Phylliss Blakes, MD [facility], [place] Pager [phone]

## 2017-01-18 NOTE — Op Note (Signed)
Operative Note  Justin GulaMatthew Varnadore  [id number]  [id number]  01/18/2017   Surgeon: Berna Buehelsea A Undray Allman  Assistant: OR staff  Procedure performed: Rectal examination under anesthesia, excision of left lateral anal skin tags 2  Preop diagnosis: History of hemorrhoid disease with residual anal skin tags Post-op diagnosis/intraop findings: Same  Specimens: Two subcentimeter left lateral anal skin tags Retained items: None EBL: 0  Complications: none  Description of procedure: After obtaining informed consent the patient was taken to the operating room and placed prone on the operating room table where monitored anesthesia care was initiated, formal timeout was performed and preoperative antibiotics administered. The perineum was prepped and draped with Betadine. 1% lidocaine with epinephrine was injected to reform a left-sided perianal block as well as in the anoderm. Beginning with the digital rectal exam which did not reveal any mass or abnormality. The patient did have a patulous anus. The speculum was introduced in the anus and rectal mucosa inspected. There is no significant internal hemorrhoid disease, no masses or rectal mucosal abnormal valleys within the visualized area. He does have engorged circumferential external hemorrhoids but none with thrombosis or overlying skin changes. I then turned to the skin tags, of which there were 2 on the left lateral aspect of the perianal skin. One measuring approximately 5 x 2 mm and the other measuring approximately 2 x 3 mm. There were both somewhat pedunculated and had a narrow stalk. I excised each of these with cautery and ensured hemostasis and the underlying wound. The remaining wounds were very small and no sutures were placed. A dry gauze dressing and mesh panties were then applied. The patient was then awakened, returned to the supine position on the stretcher and taken to PACU in stable condition.   All counts were correct at the completion  of the case.

## 2017-01-18 NOTE — Anesthesia Postprocedure Evaluation (Signed)
Anesthesia Post Note  Patient: Justin Burch  Procedure(s) Performed: Procedure(s) (LRB): EXCISION ANAL SKIN TAG, RECTAL EXAM UNDER ANESTHESIA (N/A)     Patient location during evaluation: PACU Anesthesia Type: MAC Level of consciousness: awake and alert, oriented and patient cooperative Pain management: pain level controlled Vital Signs Assessment: post-procedure vital signs reviewed and stable Respiratory status: spontaneous breathing, nonlabored ventilation and respiratory function stable Cardiovascular status: blood pressure returned to baseline and stable Postop Assessment: no signs of nausea or vomiting Anesthetic complications: no    Last Vitals:  Vitals:   01/18/17 0832 01/18/17 1152  BP: 129/86 (!) 148/78  Pulse: 73 86  Resp: 16 12  Temp: 36.9 C 36.4 C    Last Pain:  Vitals:   01/18/17 0832  TempSrc: Oral                 Pj Zehner,E. Koy Lamp

## 2017-01-18 NOTE — Anesthesia Procedure Notes (Signed)
Procedure Name: MAC Performed by: Annye Asa Pre-anesthesia Checklist: Patient identified, Emergency Drugs available, Suction available, Patient being monitored and Timeout performed Oxygen Delivery Method: Nasal cannula Placement Confirmation: positive ETCO2,  CO2 detector and breath sounds checked- equal and bilateral

## 2017-01-21 ENCOUNTER — Encounter (HOSPITAL_BASED_OUTPATIENT_CLINIC_OR_DEPARTMENT_OTHER): Payer: Self-pay | Admitting: Surgery

## 2017-03-13 ENCOUNTER — Ambulatory Visit: Payer: BLUE CROSS/BLUE SHIELD | Admitting: Family

## 2017-04-25 ENCOUNTER — Encounter (HOSPITAL_COMMUNITY): Payer: Self-pay | Admitting: Emergency Medicine

## 2017-04-25 ENCOUNTER — Ambulatory Visit (HOSPITAL_COMMUNITY)
Admission: EM | Admit: 2017-04-25 | Discharge: 2017-04-25 | Disposition: A | Discharge status: Home / Self Care | Payer: BLUE CROSS/BLUE SHIELD | Source: Home / Self Care

## 2017-04-25 ENCOUNTER — Emergency Department (HOSPITAL_COMMUNITY)
Admission: EM | Admit: 2017-04-25 | Discharge: 2017-04-25 | Disposition: A | Discharge status: Home / Self Care | Payer: BLUE CROSS/BLUE SHIELD | Attending: Emergency Medicine | Admitting: Emergency Medicine

## 2017-04-25 ENCOUNTER — Emergency Department (HOSPITAL_COMMUNITY): Payer: BLUE CROSS/BLUE SHIELD

## 2017-04-25 DIAGNOSIS — G44209 Tension-type headache, unspecified, not intractable: Secondary | ICD-10-CM | POA: Insufficient documentation

## 2017-04-25 DIAGNOSIS — R42 Dizziness and giddiness: Secondary | ICD-10-CM | POA: Diagnosis not present

## 2017-04-25 DIAGNOSIS — R51 Headache: Secondary | ICD-10-CM | POA: Diagnosis not present

## 2017-04-25 DIAGNOSIS — G44201 Tension-type headache, unspecified, intractable: Secondary | ICD-10-CM | POA: Diagnosis not present

## 2017-04-25 DIAGNOSIS — R531 Weakness: Secondary | ICD-10-CM

## 2017-04-25 LAB — COMPREHENSIVE METABOLIC PANEL
ALT: 29 U/L (ref 17–63)
AST: 28 U/L (ref 15–41)
Albumin: 4.3 g/dL (ref 3.5–5.0)
Alkaline Phosphatase: 55 U/L (ref 38–126)
Anion gap: 7 (ref 5–15)
BUN: 10 mg/dL (ref 6–20)
CO2: 26 mmol/L (ref 22–32)
Calcium: 9 mg/dL (ref 8.9–10.3)
Chloride: 103 mmol/L (ref 101–111)
Creatinine, Ser: 1.28 mg/dL — ABNORMAL HIGH (ref 0.61–1.24)
GFR calc Af Amer: >60 mL/min (ref >60)
GFR calc non Af Amer: >60 mL/min (ref >60)
Glucose, Bld: 94 mg/dL (ref 65–99)
Potassium: 4.2 mmol/L (ref 3.5–5.1)
Sodium: 136 mmol/L (ref 135–145)
Total Bilirubin: 1 mg/dL (ref 0.3–1.2)
Total Protein: 7.2 g/dL (ref 6.5–8.1)

## 2017-04-25 LAB — ETHANOL: Alcohol, Ethyl (B): <10 mg/dL (ref <10)

## 2017-04-25 LAB — CBC
HCT: 45.1 % (ref 39.0–52.0)
Hemoglobin: 15.9 g/dL (ref 13.0–17.0)
MCH: 29.6 pg (ref 26.0–34.0)
MCHC: 35.3 g/dL (ref 30.0–36.0)
MCV: 84 fL (ref 78.0–100.0)
Platelets: 219 10*3/uL (ref 150–400)
RBC: 5.37 MIL/uL (ref 4.22–5.81)
RDW: 12.7 % (ref 11.5–15.5)
WBC: 12.3 10*3/uL — ABNORMAL HIGH (ref 4.0–10.5)

## 2017-04-25 LAB — I-STAT TROPONIN, ED: Troponin i, poc: 0 ng/mL (ref 0.00–0.08)

## 2017-04-25 MED ORDER — METOCLOPRAMIDE HCL 5 MG/ML IJ SOLN
10.0000 mg | Freq: Once | INTRAMUSCULAR | Status: AC
  Administered 2017-04-25: 10 mg via INTRAVENOUS
  Filled 2017-04-25: qty 2

## 2017-04-25 MED ORDER — SODIUM CHLORIDE 0.9 % IV BOLUS (SEPSIS)
1000.0000 mL | Freq: Once | INTRAVENOUS | Status: AC
  Administered 2017-04-25: 1000 mL via INTRAVENOUS

## 2017-04-25 MED ORDER — DIPHENHYDRAMINE HCL 50 MG/ML IJ SOLN
12.5000 mg | Freq: Once | INTRAMUSCULAR | Status: AC
  Administered 2017-04-25: 12.5 mg via INTRAVENOUS
  Filled 2017-04-25: qty 1

## 2017-04-25 NOTE — ED Provider Notes (Signed)
[facility]    CSN: [id number] Arrival date & time: 04/25/17  1225     History   Chief Complaint Chief Complaint  Patient presents with  . Weakness    HPI Justin Burch is a 31 y.o. male.   Patient was driving and reports loosing focus.  Patient was very sweaty, felt hot, felt nauseated.  Incident occurred approx 30 minutes ago.  Says he still does not feel normal, but better.  He notes diplopia with lateral gaze bilaterally.  Patient says this felt similar to when he had a tia 6-7 years ago. He said this was verified by a CAT scan he had done in [place], [place] at the time  Patient has had some high blood pressure readings in the past but has never been treated. His mother had 2 strokes prior to the age of 70.      Past Medical History:  Diagnosis Date  . Alopecia   . Anal skin tag   . GERD (gastroesophageal reflux disease)   . Hemorrhoids   . History of transient ischemic attack (TIA) 2013   no residual per pt--  . Wears glasses     Patient Active Problem List   Diagnosis Date Noted  . Hair loss 11/16/2016  . Residual hemorrhoidal skin tags 11/16/2016  . Gastroesophageal reflux disease without esophagitis 11/16/2016    Past Surgical History:  Procedure Laterality Date  . EXCISION OF SKIN TAG N/A 01/18/2017   Procedure: EXCISION ANAL SKIN TAG, RECTAL EXAM UNDER ANESTHESIA;  Surgeon: Berna Bue, MD;  Location: [facility];  Service: General;  Laterality: N/A;  . HEMORROIDECTOMY    . NO PAST SURGERIES         Home Medications    Prior to Admission medications   Medication Sig Start Date End Date Taking? Authorizing Provider  omeprazole (PRILOSEC) 20 MG capsule Take 20 mg by mouth as needed.    [provider]    Family History Family History  Problem Relation Age of Onset  . Hypertension Mother   . Stroke Mother   . Ovarian cancer Mother   . Kidney disease Mother   . Healthy Father   . Lung cancer Maternal  Grandmother   . Healthy Paternal Grandmother   . Colon cancer Paternal Grandfather     Social History Social History  Substance Use Topics  . Smoking status: Never Smoker  . Smokeless tobacco: Never Used  . Alcohol use Yes     Comment: occasional     Allergies   Patient has no known allergies.   Review of Systems Review of Systems  Eyes: Positive for visual disturbance.  Neurological: Positive for weakness.  All other systems reviewed and are negative.    Physical Exam Triage Vital Signs ED Triage Vitals  Enc Vitals Group     BP      Pulse      Resp      Temp      Temp src      SpO2      Weight      Height      Head Circumference      Peak Flow      Pain Score      Pain Loc      Pain Edu?      Excl. in GC?    No data found.   Updated Vital Signs BP (!) 139/97 (BP Location: Left Arm)   Pulse 78  Temp 98.2 F (36.8 C) (Oral)   Resp 18   SpO2 96%    Physical Exam  Constitutional: He is oriented to person, place, and time. He appears well-developed and well-nourished.  HENT:  Right Ear: External ear normal.  Left Ear: External ear normal.  Mouth/Throat: Oropharynx is clear and moist.  Eyes: Pupils are equal, round, and reactive to light. Conjunctivae are normal.  Patient has 1-2 beats of nystagmus with lateral gaze either way  Neck: Normal range of motion. Neck supple.  Cardiovascular: Normal rate, regular rhythm and normal heart sounds.   Pulmonary/Chest: Effort normal and breath sounds normal.  Musculoskeletal: Normal range of motion.  Neurological: He is alert and oriented to person, place, and time. He exhibits normal muscle tone. Coordination normal.  Good bilateral strength in either arm and patient is able to squat. He has no dysmetria  Skin: Skin is warm and dry.  Nursing note and vitals reviewed.    UC Treatments / Results  Labs (all labs ordered are listed, but only abnormal results are displayed) Labs Reviewed - No data to  display  EKG  EKG Interpretation None       Radiology No results found.  Procedures Procedures (including critical care time)  Medications Ordered in UC Medications - No data to display   Initial Impression / Assessment and Plan / UC Course  I have reviewed the triage vital signs and the nursing notes.  Pertinent labs & imaging results that were available during my care of the patient were reviewed by me and considered in my medical decision making (see chart for details).     Final Clinical Impressions(s) / UC Diagnoses   Final diagnoses:  Weakness    New Prescriptions Current Discharge Medication List     To Emergency Department for further evaluation  Controlled Substance Prescriptions [place] Controlled Substance Registry consulted? Not Applicable   Elvina Sidle, MD 04/25/17 1258

## 2017-04-25 NOTE — ED Triage Notes (Addendum)
Patient was driving and reports loosing focus.  Patient was very sweaty, felt hot, felt nauseated.  Incident occurred approx 30 minutes ago.  Says he still does not feel normal, but better.    Patient says this felt similar to when he had a tia 6-7 years ago.

## 2017-04-25 NOTE — Discharge Instructions (Addendum)
Do not drive until you have been evaluated by the Neurologist.  Return to the ED for worsening symptoms.

## 2017-04-25 NOTE — ED Triage Notes (Addendum)
Pt reports he was driving and had an episode where he felt "out of focus" at noon, states he also felt diaphoretic and shaky. No limb weakness that he reports. States he went to [facility] and was told to come to ED for further eval due to TIA 7 years ago. Pt a/ox4 resp e/u, no neuro deficits, speech clear. Pupils dilated, equal and reactive to light, pt denies any drug use.

## 2017-04-25 NOTE — Discharge Instructions (Signed)
You will need urgent evaluation in the emergency department for further evaluation.

## 2017-04-25 NOTE — ED Provider Notes (Signed)
[facility]-EMERGENCY DEPT Provider Note   CSN: [id number] Arrival date & time: 04/25/17  1311  History   Chief Complaint Chief Complaint  Patient presents with  . Altered Mental Status   HPI Justin Burch is a 31 y.o. male.  The patient is a 31yo male with a past medical history significant for a reported TIA and GERD who presents to the ED with neurologic changes.  He was driving in his car approximately 5 hours ago prior to my evaluation when he had a 10-second episode of feeling "weird," which was associated with nausea and diaphoresis.  He states this is similar but not identical to his prior TIA.  He states his vision changed, however he had no loss of vision at the time.  The symptoms gradually resolved and were not associated with numbness, tingling, weakness, or any other neurologic symptoms.  He then developed a headache, worse on the left side.  At this time, he has a headache located on the left in the temporal and parietal region.  No current nausea or diaphoresis.  He denies drug use.   The history is provided by the patient and medical records. No language interpreter was used.   Past Medical History:  Diagnosis Date  . Alopecia   . Anal skin tag   . GERD (gastroesophageal reflux disease)   . Hemorrhoids   . History of transient ischemic attack (TIA) 2013   no residual per pt--  . Wears glasses    Patient Active Problem List   Diagnosis Date Noted  . Hair loss 11/16/2016  . Residual hemorrhoidal skin tags 11/16/2016  . Gastroesophageal reflux disease without esophagitis 11/16/2016   Past Surgical History:  Procedure Laterality Date  . EXCISION OF SKIN TAG N/A 01/18/2017   Procedure: EXCISION ANAL SKIN TAG, RECTAL EXAM UNDER ANESTHESIA;  Surgeon: Berna Bue, MD;  Location: [facility];  Service: General;  Laterality: N/A;  . HEMORROIDECTOMY    . NO PAST SURGERIES      Home Medications    Prior to Admission medications   Medication Sig Start  Date End Date Taking? Authorizing Provider  omeprazole (PRILOSEC) 20 MG capsule Take 20 mg by mouth as needed.    [provider]   Family History Family History  Problem Relation Age of Onset  . Hypertension Mother   . Stroke Mother   . Ovarian cancer Mother   . Kidney disease Mother   . Healthy Father   . Lung cancer Maternal Grandmother   . Healthy Paternal Grandmother   . Colon cancer Paternal Grandfather    Social History Social History  Substance Use Topics  . Smoking status: Never Smoker  . Smokeless tobacco: Never Used  . Alcohol use Yes     Comment: occasional   Allergies   Patient has no known allergies.  Review of Systems Review of Systems  Constitutional: Negative for chills and fever.  HENT: Negative for ear pain and sore throat.   Eyes: Negative for pain, redness and visual disturbance.  Respiratory: Negative for cough and shortness of breath.   Cardiovascular: Negative for chest pain and palpitations.  Gastrointestinal: Negative for abdominal pain and vomiting.  Genitourinary: Negative for dysuria and hematuria.  Musculoskeletal: Negative for back pain.  Skin: Negative for rash.  Allergic/Immunologic: Negative for immunocompromised state.  Neurological: Positive for headaches. Negative for dizziness, tremors, seizures, syncope, facial asymmetry, speech difficulty, weakness, light-headedness and numbness.  All other systems reviewed and are negative.   Physical  Exam Updated Vital Signs BP 127/68   Pulse 64   Temp 98.7 F (37.1 C) (Oral)   Resp 12   SpO2 98%   Physical Exam  Constitutional: He is oriented to person, place, and time. He appears well-developed and well-nourished.  HENT:  Head: Normocephalic and atraumatic.  Eyes: Pupils are equal, round, and reactive to light. Conjunctivae are normal.  Pupils dilated  Neck: Neck supple.  Cardiovascular: Normal rate, regular rhythm, normal heart sounds and intact distal pulses.   No murmur  heard. Pulmonary/Chest: Effort normal and breath sounds normal. No respiratory distress.  Abdominal: Soft. There is no tenderness.  Musculoskeletal: He exhibits no edema.  Neurological: He is alert and oriented to person, place, and time. He displays normal reflexes. No cranial nerve deficit or sensory deficit. He exhibits normal muscle tone. Coordination normal.  Normal gait including heel and toe walk  Skin: Skin is warm and dry.  Psychiatric: He has a normal mood and affect.  Nursing note and vitals reviewed.  EKG: - Rate: regular - Rhythm: normal sinus - Axis: no axis deviation - Intervals: normal PR, narrow QRS complex, and normal QTc - ST/T waves: no T wave or ST changes suggestive of ischemia or infarct - Comparison to prior: none available  ED Treatments / Results  Labs (all labs ordered are listed, but only abnormal results are displayed) Labs Reviewed  COMPREHENSIVE METABOLIC PANEL - Abnormal; Notable for the following:       Result Value   Creatinine, Ser 1.28 (*)    All other components within normal limits  CBC - Abnormal; Notable for the following:    WBC 12.3 (*)    All other components within normal limits  ETHANOL  CBG MONITORING, ED  I-STAT TROPONIN, ED   EKG  EKG Interpretation  Date/Time:  Thursday April 25 2017 17:02:38 EDT Ventricular Rate:  70 PR Interval:    QRS Duration: 97 QT Interval:  412 QTC Calculation: 445 R Axis:   54 Text Interpretation:  Sinus rhythm No previous ECGs available Confirmed by Richardean Canal [phone]) on 04/25/2017 5:06:24 PM      Radiology Ct Head Wo Contrast  Result Date: 04/25/2017 CLINICAL DATA:  Loss focus and dizziness lasting about 30 minutes. EXAM: CT HEAD WITHOUT CONTRAST TECHNIQUE: Contiguous axial images were obtained from the base of the skull through the vertex without intravenous contrast. COMPARISON:  None. FINDINGS: Brain: No evidence of acute infarction, hemorrhage, hydrocephalus, extra-axial collection  or mass lesion/mass effect. Vascular: No hyperdense vessel or unexpected calcification. Skull: Normal. Negative for fracture or focal lesion. Sinuses/Orbits: No acute finding. Other: None IMPRESSION: No acute intracranial abnormality. Electronically Signed   By: Tollie Eth M.D.   On: 04/25/2017 18:07    Procedures Procedures (including critical care time)  Medications Ordered in ED Medications  sodium chloride 0.9 % bolus 1,000 mL (0 mLs Intravenous Stopped 04/25/17 1918)  metoCLOPramide (REGLAN) injection 10 mg (10 mg Intravenous Given 04/25/17 1720)  diphenhydrAMINE (BENADRYL) injection 12.5 mg (12.5 mg Intravenous Given 04/25/17 1720)    Initial Impression / Assessment and Plan / ED Course  I have reviewed the triage vital signs and the nursing notes.  Pertinent labs & imaging results that were available during my care of the patient were reviewed by me and considered in my medical decision making (see chart for details).    Initial differential diagnosis included migraine headache, tension headache, syncope, intracranial hemorrhage, intracranial mass, seizure, TIA, drug intoxication.  Pertinent labs included  a normal troponin.  Normal ethanol level.  The patient did not appear clinically intoxicated, so I therefore did not perform a UDS despite his dilated pupils.  Mild leukocytosis and elevated creatinine noted on labs from Urgent Care.  EKG with NSR; no evidence of arrhythmia, decreasing my suspicion for a cardiac cause of his symptoms.  Imaging studies revealed no acute intracranial abnormalities on head CT.  The patient was given a headache cocktail for his symptoms.  Upon reassessment, his pain was moderately improved and he had no new complaints.  Based on the above findings, I believe the patient is most likely suffering from a complex migraine headache at this time.  His normal head CT decreases my suspicion for an intracranial mass or hemorrhage.  Drug intoxication remains a  possibility.  He had no focal neurologic findings on my exam.  While a TIA is possible, the patient has no risk factors aside from his reported history of the same (no further documentation available at this time).  I discussed the above results with the patient who verbalized understanding.  Return precautions and follow-up plans discussed including the importance of Neurology follow-up.  He was instructed to drink copious fluids for his mildly elevated creatinine, and instructed to not drive until evaluated by the Neurologist.  The patient was discharged in stable condition.  Final Clinical Impressions(s) / ED Diagnoses   Final diagnoses:  Acute non intractable tension-type headache   New Prescriptions Discharge Medication List as of 04/25/2017  6:59 PM       Levester Fresh, MD 04/26/17 0030    Charlynne Pander, MD 04/27/17 [phone]

## 2017-10-28 ENCOUNTER — Encounter (HOSPITAL_COMMUNITY): Payer: Self-pay | Admitting: Family Medicine

## 2017-10-28 ENCOUNTER — Ambulatory Visit (HOSPITAL_COMMUNITY)
Admission: EM | Admit: 2017-10-28 | Discharge: 2017-10-28 | Disposition: A | Discharge status: Home / Self Care | Payer: BLUE CROSS/BLUE SHIELD | Attending: Family Medicine | Admitting: Family Medicine

## 2017-10-28 ENCOUNTER — Other Ambulatory Visit: Payer: Self-pay

## 2017-10-28 DIAGNOSIS — H6123 Impacted cerumen, bilateral: Secondary | ICD-10-CM | POA: Diagnosis not present

## 2017-10-28 NOTE — ED Provider Notes (Signed)
[facility]   [id number] 10/28/17 Arrival Time: 1200   SUBJECTIVE:  Justin Burch is a 32 y.o. male who presents to the urgent care with complaint of right ear discomfort and hearing loss after using Q-tip last night.     Past Medical History:  Diagnosis Date  . Alopecia   . Anal skin tag   . GERD (gastroesophageal reflux disease)   . Hemorrhoids   . History of transient ischemic attack (TIA) 2013   no residual per pt--  . Wears glasses    Family History  Problem Relation Age of Onset  . Hypertension Mother   . Stroke Mother   . Ovarian cancer Mother   . Kidney disease Mother   . Healthy Father   . Lung cancer Maternal Grandmother   . Healthy Paternal Grandmother   . Colon cancer Paternal Grandfather    Social History   Socioeconomic History  . Marital status: Single    Spouse name: Not on file  . Number of children: 0  . Years of education: 63  . Highest education level: Not on file  Occupational History  . Occupation: Engineer, technical sales  . Financial resource strain: Not on file  . Food insecurity:    Worry: Not on file    Inability: Not on file  . Transportation needs:    Medical: Not on file    Non-medical: Not on file  Tobacco Use  . Smoking status: Never Smoker  . Smokeless tobacco: Never Used  Substance and Sexual Activity  . Alcohol use: Yes    Comment: occasional  . Drug use: No  . Sexual activity: Not on file  Lifestyle  . Physical activity:    Days per week: Not on file    Minutes per session: Not on file  . Stress: Not on file  Relationships  . Social connections:    Talks on phone: Not on file    Gets together: Not on file    Attends religious service: Not on file    Active member of club or organization: Not on file    Attends meetings of clubs or organizations: Not on file    Relationship status: Not on file  . Intimate partner violence:    Fear of current or ex partner: Not on file    Emotionally abused: Not  on file    Physically abused: Not on file    Forced sexual activity: Not on file  Other Topics Concern  . Not on file  Social History Narrative   Fun/Hobby: Video games, read   Current Meds  Medication Sig  . omeprazole (PRILOSEC) 20 MG capsule Take 20 mg by mouth as needed.   No Known Allergies    ROS: As per HPI, remainder of ROS negative.   OBJECTIVE:   Vitals:   10/28/17 1223 10/28/17 1225  BP: (!) 144/93   Pulse: 68   Resp: 16   Temp: 98.6 F (37 C)   TempSrc: Oral   SpO2: 97%   Weight:  180 lb (81.6 kg)  Height:  5\' 9"  (1.753 m)     General appearance: alert; no distress Eyes: PERRL; EOMI; conjunctiva normal HENT: normocephalic; atraumatic; ear canals both blocked by cerumen, then lavaged clear with patient hearing much better bilaterally;  external ears normal without trauma; nasal mucosa normal; oral mucosa normal Neck: supple Back: no CVA tenderness Extremities: no cyanosis or edema; symmetrical with no gross deformities Skin: warm and dry Neurologic: normal  gait; grossly normal Psychological: alert and cooperative; normal mood and affect      Labs:  Results for orders placed or performed during the hospital encounter of 04/25/17  Comprehensive metabolic panel  Result Value Ref Range   Sodium 136 135 - 145 mmol/L   Potassium 4.2 3.5 - 5.1 mmol/L   Chloride 103 101 - 111 mmol/L   CO2 26 22 - 32 mmol/L   Glucose, Bld 94 65 - 99 mg/dL   BUN 10 6 - 20 mg/dL   Creatinine, Ser 6.641.28 (H) 0.61 - 1.24 mg/dL   Calcium 9.0 8.9 - 40.310.3 mg/dL   Total Protein 7.2 6.5 - 8.1 g/dL   Albumin 4.3 3.5 - 5.0 g/dL   AST 28 15 - 41 U/L   ALT 29 17 - 63 U/L   Alkaline Phosphatase 55 38 - 126 U/L   Total Bilirubin 1.0 0.3 - 1.2 mg/dL   GFR calc non Af Amer >60 >60 mL/min   GFR calc Af Amer >60 >60 mL/min   Anion gap 7 5 - 15  CBC  Result Value Ref Range   WBC 12.3 (H) 4.0 - 10.5 K/uL   RBC 5.37 4.22 - 5.81 MIL/uL   Hemoglobin 15.9 13.0 - 17.0 g/dL   HCT 47.445.1  25.939.0 - 56.352.0 %   MCV 84.0 78.0 - 100.0 fL   MCH 29.6 26.0 - 34.0 pg   MCHC 35.3 30.0 - 36.0 g/dL   RDW 87.512.7 64.311.5 - 32.915.5 %   Platelets 219 150 - 400 K/uL  Ethanol  Result Value Ref Range   Alcohol, Ethyl (B) <10 <10 mg/dL  I-stat troponin, ED  Result Value Ref Range   Troponin i, poc 0.00 0.00 - 0.08 ng/mL   Comment 3            Labs Reviewed - No data to display  No results found.     ASSESSMENT & PLAN:  1. Bilateral impacted cerumen     No orders of the defined types were placed in this encounter.   Reviewed expectations re: course of current medical issues. Questions answered. Outlined signs and symptoms indicating need for more acute intervention. Patient verbalized understanding.     Procedures:      Elvina SidleLauenstein, Sumeet Geter, MD 10/28/17 1312

## 2017-10-28 NOTE — ED Triage Notes (Signed)
Pt presents with complaints of cleaning his ear last night with a q-tip and having ear pain since this morning. Reports also using otc ear cleaning solution.

## 2018-06-06 ENCOUNTER — Ambulatory Visit: Payer: BLUE CROSS/BLUE SHIELD | Admitting: Family

## 2018-06-12 ENCOUNTER — Ambulatory Visit: Payer: BLUE CROSS/BLUE SHIELD | Admitting: Family

## 2018-06-12 ENCOUNTER — Other Ambulatory Visit: Payer: Self-pay | Admitting: Family

## 2018-06-12 ENCOUNTER — Encounter: Payer: Self-pay | Admitting: Family

## 2018-06-12 ENCOUNTER — Other Ambulatory Visit (INDEPENDENT_AMBULATORY_CARE_PROVIDER_SITE_OTHER): Payer: BLUE CROSS/BLUE SHIELD

## 2018-06-12 VITALS — BP 116/72 | HR 90 | Temp 98.8°F | Ht 69.0 in | Wt 177.0 lb

## 2018-06-12 DIAGNOSIS — Z113 Encounter for screening for infections with a predominantly sexual mode of transmission: Secondary | ICD-10-CM | POA: Diagnosis not present

## 2018-06-12 DIAGNOSIS — R5383 Other fatigue: Secondary | ICD-10-CM

## 2018-06-12 DIAGNOSIS — Z8673 Personal history of transient ischemic attack (TIA), and cerebral infarction without residual deficits: Secondary | ICD-10-CM | POA: Diagnosis not present

## 2018-06-12 LAB — CBC WITH DIFFERENTIAL/PLATELET
Basophils Absolute: 0.1 10*3/uL (ref 0.0–0.1)
Basophils Relative: 1.1 % (ref 0.0–3.0)
Eosinophils Absolute: 0.2 10*3/uL (ref 0.0–0.7)
Eosinophils Relative: 2.5 % (ref 0.0–5.0)
HCT: 50.2 % (ref 39.0–52.0)
Hemoglobin: 16.9 g/dL (ref 13.0–17.0)
Lymphocytes Relative: 34.4 % (ref 12.0–46.0)
Lymphs Abs: 2.1 10*3/uL (ref 0.7–4.0)
MCHC: 33.7 g/dL (ref 30.0–36.0)
MCV: 85.7 fl (ref 78.0–100.0)
Monocytes Absolute: 0.7 10*3/uL (ref 0.1–1.0)
Monocytes Relative: 11.6 % (ref 3.0–12.0)
Neutro Abs: 3.1 10*3/uL (ref 1.4–7.7)
Neutrophils Relative %: 50.4 % (ref 43.0–77.0)
Platelets: 291 10*3/uL (ref 150.0–400.0)
RBC: 5.86 Mil/uL — ABNORMAL HIGH (ref 4.22–5.81)
RDW: 13.1 % (ref 11.5–15.5)
WBC: 6.1 10*3/uL (ref 4.0–10.5)

## 2018-06-12 LAB — LIPID PANEL
Cholesterol: 132 mg/dL (ref 0–200)
HDL: 32.4 mg/dL — ABNORMAL LOW (ref >39.00)
LDL Cholesterol: 78 mg/dL (ref 0–99)
NonHDL: 99.66
Total CHOL/HDL Ratio: 4
Triglycerides: 108 mg/dL (ref 0.0–149.0)
VLDL: 21.6 mg/dL (ref 0.0–40.0)

## 2018-06-12 LAB — COMPREHENSIVE METABOLIC PANEL
ALT: 13 U/L (ref 0–53)
AST: 16 U/L (ref 0–37)
Albumin: 4.5 g/dL (ref 3.5–5.2)
Alkaline Phosphatase: 69 U/L (ref 39–117)
BUN: 12 mg/dL (ref 6–23)
CO2: 29 mEq/L (ref 19–32)
Calcium: 9.7 mg/dL (ref 8.4–10.5)
Chloride: 102 mEq/L (ref 96–112)
Creatinine, Ser: 1.16 mg/dL (ref 0.40–1.50)
GFR: 77.83 mL/min (ref >60.00)
Glucose, Bld: 91 mg/dL (ref 70–99)
Potassium: 4.9 mEq/L (ref 3.5–5.1)
Sodium: 139 mEq/L (ref 135–145)
Total Bilirubin: 0.7 mg/dL (ref 0.2–1.2)
Total Protein: 7.5 g/dL (ref 6.0–8.3)

## 2018-06-12 LAB — VITAMIN D 25 HYDROXY (VIT D DEFICIENCY, FRACTURES): VITD: 8.66 ng/mL — ABNORMAL LOW (ref 30.00–100.00)

## 2018-06-12 LAB — VITAMIN B12: Vitamin B-12: 152 pg/mL — ABNORMAL LOW (ref 211–911)

## 2018-06-12 LAB — TSH: TSH: 0.8 u[IU]/mL (ref 0.35–4.50)

## 2018-06-12 MED ORDER — VITAMIN D (ERGOCALCIFEROL) 1.25 MG (50000 UNIT) PO CAPS: 50000.0000 [IU] | ORAL_CAPSULE | ORAL | 0 refills | Status: DC

## 2018-06-12 NOTE — Progress Notes (Unsigned)
gc pr

## 2018-06-12 NOTE — Progress Notes (Signed)
Justin Burch is a 32 y.o. male with the following history as recorded in [employer]:  Patient Active Problem List   Diagnosis Date Noted  . Hair loss 11/16/2016  . Residual hemorrhoidal skin tags 11/16/2016  . Gastroesophageal reflux disease without esophagitis 11/16/2016    No current outpatient medications on file.   No current facility-administered medications for this visit.     Allergies: Patient has no known allergies.  Past Medical History:  Diagnosis Date  . Alopecia   . Anal skin tag   . GERD (gastroesophageal reflux disease)   . Hemorrhoids   . History of transient ischemic attack (TIA) 2013   no residual per pt--  . Wears glasses     Past Surgical History:  Procedure Laterality Date  . EXCISION OF SKIN TAG N/A 01/18/2017   Procedure: EXCISION ANAL SKIN TAG, RECTAL EXAM UNDER ANESTHESIA;  Surgeon: Clovis Riley, MD;  Location: [place];  Service: General;  Laterality: N/A;  . HEMORROIDECTOMY    . NO PAST SURGERIES      Family History  Problem Relation Age of Onset  . Hypertension Mother   . Stroke Mother   . Ovarian cancer Mother   . Kidney disease Mother   . Healthy Father   . Lung cancer Maternal Grandmother   . Healthy Paternal Grandmother   . Colon cancer Paternal Grandfather     Social History   Tobacco Use  . Smoking status: Never Smoker  . Smokeless tobacco: Never Used  Substance Use Topics  . Alcohol use: Yes    Comment: occasional    Subjective:  Patient presents with requests for STD screen; would also interested in starting Truvada;  Also mentions long-term concerns for depression; feels like he is not motivated/ "just can't convince myself to do things I need to do." Notes that these symptoms have been present for years- seems worse since going back to school; Denies any concerns for anxiety/ suicidal thoughts;   Review is PMH- per patient, he had TIA in 2012/ 2013; diagnosed at ER in [place]; was told to start taking  aspirin and see neurology; uninsured at the time and unable to follow-up; no residual complication; per patient, however, he did not have high blood pressure at the time; no personal history of diabetes; Mother has had stroke(s) but history of HTN, smoker;   Would like to get a flu shot today;    Objective:  Vitals:   06/12/18 1324  BP: 116/72  Pulse: 90  Temp: 98.8 F (37.1 C)  TempSrc: Oral  SpO2: 96%  Weight: 177 lb 0.6 oz (80.3 kg)  Height: '5\' 9"'  (1.753 m)    General: Well developed, well nourished, in no acute distress  Skin : Warm and dry.  Head: Normocephalic and atraumatic  Eyes: Sclera and conjunctiva clear; pupils round and reactive to light; extraocular movements intact  Ears: External normal; canals clear; tympanic membranes normal  Oropharynx: Pink, supple. No suspicious lesions  Neck: Supple without thyromegaly, adenopathy  Lungs: Respirations unlabored; clear to auscultation bilaterally without wheeze, rales, rhonchi  CVS exam: normal rate and regular rhythm.  Neurologic: Alert and oriented; speech intact; face symmetrical; moves all extremities well; CNII-XII intact without focal deficit   Assessment:  1. Other fatigue   2. Screen for STD (sexually transmitted disease)   3. History of TIA (transient ischemic attack)     Plan:  1. Will update labs today to rule out metabolic cause for the fatigue; assuming labs  are normal, will plan to have patient try Wellbutrin; discussed this could help with fatigue and motivation; follow-up to be determined. 2. Update STD screen as requested; assuming labs are normal, plan to start Truvada; patient understands he needs to be seen every 3 months for labs/ medication follow up upon starting this medication. 3. Check lipid panel today; will refer to neurology; patient unable to get records but am concerned as to why a 32 year old healthy patient has unprovoked TIA; follow-up to be determined.   No follow-ups on file.  Orders  Placed This Encounter  Procedures  . CBC w/Diff    Standing Status:   Future    Number of Occurrences:   1    Standing Expiration Date:   06/12/2019  . Comp Met (CMET)    Standing Status:   Future    Number of Occurrences:   1    Standing Expiration Date:   06/12/2019  . TSH    Standing Status:   Future    Number of Occurrences:   1    Standing Expiration Date:   06/12/2019  . Vitamin D (25 hydroxy)    Standing Status:   Future    Number of Occurrences:   1    Standing Expiration Date:   06/12/2019  . B12    Standing Status:   Future    Number of Occurrences:   1    Standing Expiration Date:   06/12/2019  . HIV Antibody (routine testing w rflx)    Standing Status:   Future    Number of Occurrences:   1    Standing Expiration Date:   06/13/2019  . RPR    Standing Status:   Future    Number of Occurrences:   1    Standing Expiration Date:   06/12/2019  . GC Probe amplification, urine    Standing Status:   Future    Number of Occurrences:   1    Standing Expiration Date:   06/12/2019  . Lipid panel    Standing Status:   Future    Standing Expiration Date:   06/13/2019  . Ambulatory referral to Neurology    Referral Priority:   Routine    Referral Type:   Consultation    Referral Reason:   Specialty Services Required    Requested Specialty:   Neurology    Number of Visits Requested:   1    Requested Prescriptions    No prescriptions requested or ordered in this encounter

## 2018-06-13 ENCOUNTER — Encounter: Payer: Self-pay | Admitting: Neurology

## 2018-06-13 LAB — RPR: RPR Ser Ql: NONREACTIVE

## 2018-06-13 LAB — HIV ANTIBODY (ROUTINE TESTING W REFLEX): HIV 1&2 Ab, 4th Generation: NONREACTIVE

## 2018-06-14 LAB — GC/CHLAMYDIA PROBE AMP
Chlamydia trachomatis, NAA: NEGATIVE
Neisseria gonorrhoeae by PCR: NEGATIVE

## 2018-06-15 ENCOUNTER — Encounter: Payer: Self-pay | Admitting: Family

## 2018-06-16 ENCOUNTER — Ambulatory Visit: Payer: BLUE CROSS/BLUE SHIELD

## 2018-06-18 ENCOUNTER — Ambulatory Visit (INDEPENDENT_AMBULATORY_CARE_PROVIDER_SITE_OTHER): Payer: BLUE CROSS/BLUE SHIELD

## 2018-06-18 DIAGNOSIS — E538 Deficiency of other specified B group vitamins: Secondary | ICD-10-CM | POA: Diagnosis not present

## 2018-06-18 MED ORDER — CYANOCOBALAMIN 1000 MCG/ML IJ SOLN
1000.0000 ug | Freq: Once | INTRAMUSCULAR | Status: AC
  Administered 2018-06-18: 1000 ug via INTRAMUSCULAR

## 2018-06-25 ENCOUNTER — Ambulatory Visit (INDEPENDENT_AMBULATORY_CARE_PROVIDER_SITE_OTHER): Payer: BLUE CROSS/BLUE SHIELD | Admitting: Emergency Medicine

## 2018-06-25 DIAGNOSIS — E538 Deficiency of other specified B group vitamins: Secondary | ICD-10-CM

## 2018-06-25 MED ORDER — CYANOCOBALAMIN 1000 MCG/ML IJ SOLN
1000.0000 ug | Freq: Once | INTRAMUSCULAR | Status: AC
  Administered 2018-06-25: 1000 ug via INTRAMUSCULAR

## 2018-06-30 NOTE — Progress Notes (Signed)
NEUROLOGY CONSULTATION NOTE  Justin Burch MRN: [id number] DOB: 12-03-1985  Referring provider: Olive Bass, FNP Primary care provider: Olive Bass, FNP  Reason for consult:  History of TIA  HISTORY OF PRESENT ILLNESS: Justin Burch is a 32 year old right-handed male who presents for history of TIA.  History supplemented by referring providers note.  Around 2011, he was working when he developed right sided facial tingling and difficulty concentrating, lasting 2 hours.  There was no associated headache, slurred speech, language dysfunction, or unilateral numbness or weakness.  He went to the ED in [place], [place], where he received a head CT which was negative.  He is not sure but he may have been diagnosed with a TIA.  At the time, he was told to start aspirin daily.  He took it for a little while and subsequently discontinued.    Since then, he has had similar episodes, about 2 or 3.  Last episode occurred on 04/25/17 where he was sent to the [facility] ED from Urgent Care.  It lasted about 30 minutes.  Notes at that time mention that he endorsed double vision but he does not recall.  Symptoms also included diaphoresis and feeling nauseous and hot.  Note also mentions that he developed left sided temporal/parietal headache.  CT of head was personally reviewed and was unremarkable.  When he was 18, he had a severe headache but was diagnosed with Mono.  Otherwise, no prior history of headaches.  He has no risk factors, such as hypertension, hyperlipidemia, diabetes, tobacco use, or arrhythmia. His mother, who had hypertension and was a smoker, had a stroke.  There is no family history of migraines.  PAST MEDICAL HISTORY: Past Medical History:  Diagnosis Date  . Alopecia   . Anal skin tag   . GERD (gastroesophageal reflux disease)   . Hemorrhoids   . History of transient ischemic attack (TIA) 2013   no residual per pt--  . Wears glasses     PAST SURGICAL  HISTORY: Past Surgical History:  Procedure Laterality Date  . EXCISION OF SKIN TAG N/A 01/18/2017   Procedure: EXCISION ANAL SKIN TAG, RECTAL EXAM UNDER ANESTHESIA;  Surgeon: Berna Bue, MD;  Location: [facility] [place];  Service: General;  Laterality: N/A;  . HEMORROIDECTOMY    . NO PAST SURGERIES      MEDICATIONS: Current Outpatient Medications on File Prior to Visit  Medication Sig Dispense Refill  . Vitamin D, Ergocalciferol, (DRISDOL) 1.25 MG (50000 UT) CAPS capsule Take 1 capsule (50,000 Units total) by mouth every 7 (seven) days for 12 doses. 12 capsule 0   No current facility-administered medications on file prior to visit.     ALLERGIES: No Known Allergies  FAMILY HISTORY: Family History  Problem Relation Age of Onset  . Hypertension Mother   . Stroke Mother   . Ovarian cancer Mother   . Kidney disease Mother   . Healthy Father   . Lung cancer Maternal Grandmother   . Healthy Paternal Grandmother   . Colon cancer Paternal Grandfather     SOCIAL HISTORY: Social History   Socioeconomic History  . Marital status: Single    Spouse name: Not on file  . Number of children: 0  . Years of education: 14  . Highest education level: Not on file  Occupational History  . Occupation: Engineer, technical sales  . Financial resource strain: Not on file  . Food insecurity:    Worry: Not  on file    Inability: Not on file  . Transportation needs:    Medical: Not on file    Non-medical: Not on file  Tobacco Use  . Smoking status: Never Smoker  . Smokeless tobacco: Never Used  Substance and Sexual Activity  . Alcohol use: Yes    Comment: occasional  . Drug use: No  . Sexual activity: Not on file  Lifestyle  . Physical activity:    Days per week: Not on file    Minutes per session: Not on file  . Stress: Not on file  Relationships  . Social connections:    Talks on phone: Not on file    Gets together: Not on file    Attends religious service:  Not on file    Active member of club or organization: Not on file    Attends meetings of clubs or organizations: Not on file    Relationship status: Not on file  . Intimate partner violence:    Fear of current or ex partner: Not on file    Emotionally abused: Not on file    Physically abused: Not on file    Forced sexual activity: Not on file  Other Topics Concern  . Not on file  Social History Narrative   Fun/Hobby: Video games, read    REVIEW OF SYSTEMS: Constitutional: No fevers, chills, or sweats, no generalized fatigue, change in appetite Eyes: No visual changes, double vision, eye pain Ear, nose and throat: No hearing loss, ear pain, nasal congestion, sore throat Cardiovascular: No chest pain, palpitations Respiratory:  No shortness of breath at rest or with exertion, wheezes GastrointestinaI: No nausea, vomiting, diarrhea, abdominal pain, fecal incontinence Genitourinary:  No dysuria, urinary retention or frequency Musculoskeletal:  No neck pain, back pain Integumentary: No rash, pruritus, skin lesions Neurological: as above Psychiatric: No depression, insomnia, anxiety Endocrine: No palpitations, fatigue, diaphoresis, mood swings, change in appetite, change in weight, increased thirst Hematologic/Lymphatic:  No purpura, petechiae. Allergic/Immunologic: no itchy/runny eyes, nasal congestion, recent allergic reactions, rashes  PHYSICAL EXAM: Blood pressure 106/78, pulse 68, height 5\' 9"  (1.753 m), weight 179 lb (81.2 kg), SpO2 97 %. General: No acute distress.  Patient appears well-groomed.   Head:  Normocephalic/atraumatic Eyes:  fundi examined but not visualized Neck: supple, no paraspinal tenderness, full range of motion Back: No paraspinal tenderness Heart: regular rate and rhythm Lungs: Clear to auscultation bilaterally. Vascular: No carotid bruits. Neurological Exam: Mental status: alert and oriented to person, place, and time, recent and remote memory intact,  fund of knowledge intact, attention and concentration intact, speech fluent and not dysarthric, language intact. Cranial nerves: CN I: not tested CN II: pupils equal, round and reactive to light, visual fields intact CN III, IV, VI:  full range of motion, no nystagmus, no ptosis CN V: facial sensation intact CN VII: upper and lower face symmetric CN VIII: hearing intact CN IX, X: gag intact, uvula midline CN XI: sternocleidomastoid and trapezius muscles intact CN XII: tongue midline Bulk & Tone: normal, no fasciculations. Motor:  5/5 throughout  Sensation:  temperature and vibration sensation intact.  Deep Tendon Reflexes:  2+ throughout, toes downgoing.   Finger to nose testing:  Without dysmetria.   Heel to shin:  Without dysmetria.   Gait:  Normal station and stride.  Able to turn and tandem walk. Romberg negative.  IMPRESSION: I believe that Mr. Nancarrow spells actually are complicated migraines (migraine with aura, without status migrainosus, not intractable).  This is supported  by the fact that his recurrent spells are generally with the same semiology and he is young with no stroke-risk factors.  At least one of these spells was associated with headache.  I do not think that these spells are seizures.  PLAN: As they are infrequent, no preventative medication is required From my standpoint, he does not need to be on antiplatelet therapy He may follow up as needed.  Thank you for allowing me to take part in the care of this patient.  Shon MilletAdam Khara Renaud, DO  CC: Olive BassLaura Woodruff Murray, FNP

## 2018-07-01 ENCOUNTER — Encounter: Payer: Self-pay | Admitting: Neurology

## 2018-07-01 ENCOUNTER — Ambulatory Visit: Payer: BLUE CROSS/BLUE SHIELD | Admitting: Neurology

## 2018-07-01 VITALS — BP 106/78 | HR 68 | Ht 69.0 in | Wt 179.0 lb

## 2018-07-01 DIAGNOSIS — G43109 Migraine with aura, not intractable, without status migrainosus: Secondary | ICD-10-CM

## 2018-07-01 NOTE — Patient Instructions (Signed)
I think your episodes are migraines.  I do not think they are TIAs/strokes.  Therefore, I don't think you need to be on aspirin.  You may follow up as needed.

## 2018-07-02 ENCOUNTER — Ambulatory Visit (INDEPENDENT_AMBULATORY_CARE_PROVIDER_SITE_OTHER): Payer: BLUE CROSS/BLUE SHIELD

## 2018-07-02 DIAGNOSIS — E538 Deficiency of other specified B group vitamins: Secondary | ICD-10-CM

## 2018-07-02 MED ORDER — CYANOCOBALAMIN 1000 MCG/ML IJ SOLN
1000.0000 ug | Freq: Once | INTRAMUSCULAR | Status: AC
  Administered 2018-07-02: 1000 ug via INTRAMUSCULAR

## 2018-07-02 NOTE — Progress Notes (Signed)
I have reviewed and agree.

## 2018-07-09 ENCOUNTER — Ambulatory Visit: Payer: BLUE CROSS/BLUE SHIELD

## 2018-07-16 ENCOUNTER — Telehealth: Payer: Self-pay | Admitting: Family

## 2018-07-16 NOTE — Telephone Encounter (Signed)
Patient read my-chart message. Awaiting his response.

## 2018-07-16 NOTE — Telephone Encounter (Signed)
Also sent patient a my chart message as well.

## 2018-07-16 NOTE — Telephone Encounter (Signed)
Called and left message for patient to return call to clinic regarding his B12 and starting medications.

## 2018-07-16 NOTE — Telephone Encounter (Signed)
Can you call and check on him? I was needing to follow-up on him after he got the B12 injections. We need to see if he still wants to start medications.

## 2018-07-17 NOTE — Telephone Encounter (Signed)
FYI

## 2018-07-18 ENCOUNTER — Other Ambulatory Visit: Payer: Self-pay | Admitting: Family

## 2018-07-18 MED ORDER — EMTRICITABINE-TENOFOVIR AF 200-25 MG PO TABS: 1.0000 | ORAL_TABLET | Freq: Every day | ORAL | 0 refills | Status: DC

## 2018-07-21 ENCOUNTER — Other Ambulatory Visit: Payer: Self-pay | Admitting: Family

## 2018-07-21 MED ORDER — BUPROPION HCL ER (XL) 150 MG PO TB24: 150.0000 mg | ORAL_TABLET | Freq: Every day | ORAL | 1 refills | Status: DC

## 2018-08-20 ENCOUNTER — Other Ambulatory Visit: Payer: BLUE CROSS/BLUE SHIELD

## 2018-08-20 ENCOUNTER — Encounter: Payer: Self-pay | Admitting: Family

## 2018-08-20 ENCOUNTER — Other Ambulatory Visit (INDEPENDENT_AMBULATORY_CARE_PROVIDER_SITE_OTHER): Payer: BLUE CROSS/BLUE SHIELD

## 2018-08-20 ENCOUNTER — Ambulatory Visit (INDEPENDENT_AMBULATORY_CARE_PROVIDER_SITE_OTHER): Payer: BLUE CROSS/BLUE SHIELD | Admitting: Family

## 2018-08-20 VITALS — BP 108/80 | HR 70 | Temp 98.4°F | Ht 69.0 in | Wt 176.1 lb

## 2018-08-20 DIAGNOSIS — Z79899 Other long term (current) drug therapy: Secondary | ICD-10-CM | POA: Diagnosis not present

## 2018-08-20 DIAGNOSIS — D519 Vitamin B12 deficiency anemia, unspecified: Secondary | ICD-10-CM

## 2018-08-20 DIAGNOSIS — E559 Vitamin D deficiency, unspecified: Secondary | ICD-10-CM

## 2018-08-20 DIAGNOSIS — F329 Major depressive disorder, single episode, unspecified: Secondary | ICD-10-CM

## 2018-08-20 DIAGNOSIS — F32A Depression, unspecified: Secondary | ICD-10-CM

## 2018-08-20 LAB — COMPREHENSIVE METABOLIC PANEL
ALT: 11 U/L (ref 0–53)
AST: 13 U/L (ref 0–37)
Albumin: 4.7 g/dL (ref 3.5–5.2)
Alkaline Phosphatase: 69 U/L (ref 39–117)
BUN: 11 mg/dL (ref 6–23)
CO2: 30 mEq/L (ref 19–32)
Calcium: 9.8 mg/dL (ref 8.4–10.5)
Chloride: 102 mEq/L (ref 96–112)
Creatinine, Ser: 1.2 mg/dL (ref 0.40–1.50)
GFR: 70.34 mL/min (ref >60.00)
Glucose, Bld: 83 mg/dL (ref 70–99)
Potassium: 4.2 mEq/L (ref 3.5–5.1)
Sodium: 138 mEq/L (ref 135–145)
Total Bilirubin: 0.9 mg/dL (ref 0.2–1.2)
Total Protein: 7.6 g/dL (ref 6.0–8.3)

## 2018-08-20 LAB — CBC WITH DIFFERENTIAL/PLATELET
Basophils Absolute: 0 10*3/uL (ref 0.0–0.1)
Basophils Relative: 0.8 % (ref 0.0–3.0)
Eosinophils Absolute: 0.1 10*3/uL (ref 0.0–0.7)
Eosinophils Relative: 2.4 % (ref 0.0–5.0)
HCT: 49.2 % (ref 39.0–52.0)
Hemoglobin: 17 g/dL (ref 13.0–17.0)
Lymphocytes Relative: 34.1 % (ref 12.0–46.0)
Lymphs Abs: 1.5 10*3/uL (ref 0.7–4.0)
MCHC: 34.5 g/dL (ref 30.0–36.0)
MCV: 86.2 fl (ref 78.0–100.0)
Monocytes Absolute: 0.4 10*3/uL (ref 0.1–1.0)
Monocytes Relative: 9.9 % (ref 3.0–12.0)
Neutro Abs: 2.3 10*3/uL (ref 1.4–7.7)
Neutrophils Relative %: 52.8 % (ref 43.0–77.0)
Platelets: 230 10*3/uL (ref 150.0–400.0)
RBC: 5.71 Mil/uL (ref 4.22–5.81)
RDW: 13.9 % (ref 11.5–15.5)
WBC: 4.4 10*3/uL (ref 4.0–10.5)

## 2018-08-20 LAB — VITAMIN D 25 HYDROXY (VIT D DEFICIENCY, FRACTURES): VITD: 34.44 ng/mL (ref 30.00–100.00)

## 2018-08-20 MED ORDER — EMTRICITABINE-TENOFOVIR AF 200-25 MG PO TABS: 1.0000 | ORAL_TABLET | Freq: Every day | ORAL | 2 refills | Status: DC

## 2018-08-20 NOTE — Progress Notes (Signed)
Justin Burch is a 33 y.o. male with the following history as recorded in EpicCare:  Patient Active Problem List   Diagnosis Date Noted  . Hair loss 11/16/2016  . Residual hemorrhoidal skin tags 11/16/2016  . Gastroesophageal reflux disease without esophagitis 11/16/2016    Current Outpatient Medications  Medication Sig Dispense Refill  . buPROPion (WELLBUTRIN XL) 150 MG 24 hr tablet Take 1 tablet (150 mg total) by mouth daily. 30 tablet 1  . emtricitabine-tenofovir AF (DESCOVY) 200-25 MG tablet Take 1 tablet by mouth daily. 30 tablet 2   No current facility-administered medications for this visit.     Allergies: Patient has no known allergies.  Past Medical History:  Diagnosis Date  . Alopecia   . Anal skin tag   . GERD (gastroesophageal reflux disease)   . Hemorrhoids   . History of transient ischemic attack (TIA) 2013   no residual per pt--  . Wears glasses     Past Surgical History:  Procedure Laterality Date  . EXCISION OF SKIN TAG N/A 01/18/2017   Procedure: EXCISION ANAL SKIN TAG, RECTAL EXAM UNDER ANESTHESIA;  Surgeon: Clovis Riley, MD;  Location: [place];  Service: General;  Laterality: N/A;  . HEMORROIDECTOMY    . NO PAST SURGERIES      Family History  Problem Relation Age of Onset  . Hypertension Mother   . Stroke Mother   . Ovarian cancer Mother   . Kidney disease Mother   . Healthy Father   . Lung cancer Maternal Grandmother   . Healthy Paternal Grandmother   . Colon cancer Paternal Grandfather     Social History   Tobacco Use  . Smoking status: Never Smoker  . Smokeless tobacco: Never Used  Substance Use Topics  . Alcohol use: Yes    Comment: occasional    Subjective:   Patient presents to follow-up on numerous concerns; at last OV, was found to have B12 deficiency and Vitamin D deficiency; does feel that symptoms of fatigue are definitely improved- does not feel like he is having to take so many nights/ able to function  with 8 hours of sleep;  Tolerating Descovy well; able to get through specialty pharmacy/ co-pay card; agreeable to updating labs;  Questioning benefit of Wellbutrin- not sure as effective as had hoped; would be open to increasing doage.     Objective:  Vitals:   08/20/18 0943  BP: 108/80  Pulse: 70  Temp: 98.4 F (36.9 C)  TempSrc: Oral  SpO2: 97%  Weight: 176 lb 1.3 oz (79.9 kg)  Height: '5\' 9"'  (8.588 m)    General: Well developed, well nourished, in no acute distress  Skin : Warm and dry.  Head: Normocephalic and atraumatic  Lungs: Respirations unlabored;  CVS exam: normal rate and regular rhythm.  Neurologic: Alert and oriented; speech intact; face symmetrical; moves all extremities well; CNII-XII intact without focal deficit   Assessment:  1. Anemia due to vitamin B12 deficiency, unspecified B12 deficiency type   2. Vitamin D deficiency   3. High risk medication use   4. Depression, unspecified depression type     Plan:  1. Check B12 level today; follow-up to be determined. 2. Update vitamin D level today; 3. Continue Descovy- refill updated; update labs today; follow-up in 3 months, sooner prn. 4. Try increasing Wellbutrin XL to 300 mg daily; call back with response in 2 weeks; may need to consider SSRI.    No follow-ups on file.  Orders  Placed This Encounter  Procedures  . HIV Antibody (routine testing w rflx)    Standing Status:   Future    Number of Occurrences:   1    Standing Expiration Date:   08/21/2019  . B12    Standing Status:   Future    Number of Occurrences:   1    Standing Expiration Date:   08/20/2019  . Vitamin D (25 hydroxy)    Standing Status:   Future    Number of Occurrences:   1    Standing Expiration Date:   08/20/2019  . CBC w/Diff    Standing Status:   Future    Number of Occurrences:   1    Standing Expiration Date:   08/20/2019  . Comp Met (CMET)    Standing Status:   Future    Number of Occurrences:   1    Standing Expiration  Date:   08/20/2019  . Hepatitis B Surface AntiGEN    Standing Status:   Future    Number of Occurrences:   1    Standing Expiration Date:   08/20/2019    Requested Prescriptions   Signed Prescriptions Disp Refills  . emtricitabine-tenofovir AF (DESCOVY) 200-25 MG tablet 30 tablet 2    Sig: Take 1 tablet by mouth daily.

## 2018-08-21 LAB — VITAMIN B12: Vitamin B-12: 157 pg/mL — ABNORMAL LOW (ref 211–911)

## 2018-08-21 LAB — HIV ANTIBODY (ROUTINE TESTING W REFLEX): HIV 1&2 Ab, 4th Generation: NONREACTIVE

## 2018-08-21 LAB — HEPATITIS B SURFACE ANTIGEN: Hepatitis B Surface Ag: NONREACTIVE

## 2018-08-22 ENCOUNTER — Telehealth: Payer: Self-pay | Admitting: Family

## 2018-08-22 NOTE — Telephone Encounter (Signed)
Copied from CRM [id number]. Topic: Quick Communication - See Telephone Encounter >> Aug 22, 2018  3:17 PM Arlyss Gandy, NT wrote: CRM for notification. See Telephone encounter for: 08/22/18. Pt calling back to receive lab results. Verified with office that NT can give results.

## 2018-08-22 NOTE — Telephone Encounter (Signed)
Results given and documented in result note. 

## 2018-08-25 ENCOUNTER — Encounter

## 2018-08-25 ENCOUNTER — Ambulatory Visit: Payer: BLUE CROSS/BLUE SHIELD | Admitting: Neurology

## 2018-08-25 ENCOUNTER — Ambulatory Visit (INDEPENDENT_AMBULATORY_CARE_PROVIDER_SITE_OTHER): Payer: BLUE CROSS/BLUE SHIELD

## 2018-08-25 DIAGNOSIS — E538 Deficiency of other specified B group vitamins: Secondary | ICD-10-CM

## 2018-08-25 MED ORDER — CYANOCOBALAMIN 1000 MCG/ML IJ SOLN
1000.0000 ug | Freq: Once | INTRAMUSCULAR | Status: AC
  Administered 2018-08-25: 1000 ug via INTRAMUSCULAR

## 2018-08-29 NOTE — Progress Notes (Signed)
Agree with plan to give B12;  

## 2018-09-02 ENCOUNTER — Emergency Department (HOSPITAL_COMMUNITY): Payer: BLUE CROSS/BLUE SHIELD

## 2018-09-02 ENCOUNTER — Encounter (HOSPITAL_COMMUNITY): Payer: Self-pay | Admitting: Emergency Medicine

## 2018-09-02 ENCOUNTER — Emergency Department (HOSPITAL_COMMUNITY)
Admission: EM | Admit: 2018-09-02 | Discharge: 2018-09-02 | Disposition: A | Discharge status: Home / Self Care | Payer: BLUE CROSS/BLUE SHIELD | Attending: Emergency Medicine | Admitting: Emergency Medicine

## 2018-09-02 ENCOUNTER — Ambulatory Visit: Payer: BLUE CROSS/BLUE SHIELD

## 2018-09-02 ENCOUNTER — Encounter: Payer: Self-pay | Admitting: Family

## 2018-09-02 DIAGNOSIS — Y92410 Unspecified street and highway as the place of occurrence of the external cause: Secondary | ICD-10-CM | POA: Insufficient documentation

## 2018-09-02 DIAGNOSIS — W010XXA Fall on same level from slipping, tripping and stumbling without subsequent striking against object, initial encounter: Secondary | ICD-10-CM | POA: Insufficient documentation

## 2018-09-02 DIAGNOSIS — Z79899 Other long term (current) drug therapy: Secondary | ICD-10-CM | POA: Diagnosis not present

## 2018-09-02 DIAGNOSIS — Y999 Unspecified external cause status: Secondary | ICD-10-CM | POA: Insufficient documentation

## 2018-09-02 DIAGNOSIS — S52124A Nondisplaced fracture of head of right radius, initial encounter for closed fracture: Secondary | ICD-10-CM | POA: Diagnosis not present

## 2018-09-02 DIAGNOSIS — M25521 Pain in right elbow: Secondary | ICD-10-CM | POA: Diagnosis not present

## 2018-09-02 DIAGNOSIS — S60512A Abrasion of left hand, initial encounter: Secondary | ICD-10-CM | POA: Diagnosis not present

## 2018-09-02 DIAGNOSIS — Y9302 Activity, running: Secondary | ICD-10-CM | POA: Diagnosis not present

## 2018-09-02 DIAGNOSIS — M25532 Pain in left wrist: Secondary | ICD-10-CM | POA: Diagnosis not present

## 2018-09-02 DIAGNOSIS — S59901A Unspecified injury of right elbow, initial encounter: Secondary | ICD-10-CM | POA: Diagnosis not present

## 2018-09-02 DIAGNOSIS — S6992XA Unspecified injury of left wrist, hand and finger(s), initial encounter: Secondary | ICD-10-CM | POA: Diagnosis not present

## 2018-09-02 NOTE — Discharge Instructions (Addendum)
Please return for any problem.  Follow-up with your regular care provider as instructed.  Follow-up with Dr. Ophelia Charter of [facility].  Use sling and splint as instructed.  Take Tylenol for pain.

## 2018-09-02 NOTE — ED Provider Notes (Signed)
[facility]-EMERGENCY DEPT Provider Note   CSN: [id number] Arrival date & time: 09/02/18  0809     History   Chief Complaint Chief Complaint  Patient presents with  . Arm Pain  . Wrist Pain    HPI Justin Burch is a 33 y.o. male.  33 year old male with prior medical history as detailed below presents for evaluation of right elbow pain.  Patient reports a fall from standing that occurred yesterday evening.  Patient complains of pain to the right elbow.  This is worse with movement of the elbow.  During his fall he also suffered some mild abrasions to the left palm.  He denies other injury.  He took some ibuprofen at home earlier today for treatment of his pain.  The history is provided by the patient and a friend.  Arm Injury  Location:  Elbow Elbow location:  R elbow Pain details:    Quality:  Aching   Severity:  Mild   Onset quality:  Sudden   Duration:  1 day   Timing:  Constant   Progression:  Unchanged Handedness:  Right-handed Dislocation: no   Relieved by:  Nothing Worsened by:  Movement Associated symptoms: no fever     Past Medical History:  Diagnosis Date  . Alopecia   . Anal skin tag   . GERD (gastroesophageal reflux disease)   . Hemorrhoids   . History of transient ischemic attack (TIA) 2013   no residual per pt--  . Wears glasses     Patient Active Problem List   Diagnosis Date Noted  . Hair loss 11/16/2016  . Residual hemorrhoidal skin tags 11/16/2016  . Gastroesophageal reflux disease without esophagitis 11/16/2016    Past Surgical History:  Procedure Laterality Date  . EXCISION OF SKIN TAG N/A 01/18/2017   Procedure: EXCISION ANAL SKIN TAG, RECTAL EXAM UNDER ANESTHESIA;  Surgeon: Berna Bueonnor, Chelsea A, MD;  [facility] [place];  Service: General;  Laterality: N/A;  . HEMORROIDECTOMY    . NO PAST SURGERIES          Home Medications    Prior to Admission medications   Medication Sig Start Date End  Date Taking? Authorizing Provider  buPROPion (WELLBUTRIN XL) 150 MG 24 hr tablet Take 1 tablet (150 mg total) by mouth daily. 07/21/18   Olive BassMurray, Laura Woodruff, FNP  emtricitabine-tenofovir AF (DESCOVY) 200-25 MG tablet Take 1 tablet by mouth daily. 08/20/18   Olive BassMurray, Laura Woodruff, FNP    Family History Family History  Problem Relation Age of Onset  . Hypertension Mother   . Stroke Mother   . Ovarian cancer Mother   . Kidney disease Mother   . Healthy Father   . Lung cancer Maternal Grandmother   . Healthy Paternal Grandmother   . Colon cancer Paternal Grandfather     Social History Social History   Tobacco Use  . Smoking status: Never Smoker  . Smokeless tobacco: Never Used  Substance Use Topics  . Alcohol use: Yes    Comment: occasional  . Drug use: No     Allergies   Patient has no known allergies.   Review of Systems Review of Systems  Constitutional: Negative for fever.  All other systems reviewed and are negative.    Physical Exam Updated Vital Signs BP 128/89 (BP Location: Left Arm)   Pulse 85   Temp 98.9 F (37.2 C) (Oral)   Resp 16   SpO2 99%   Physical Exam Vitals signs and nursing  note reviewed.  Constitutional:      General: He is not in acute distress.    Appearance: Normal appearance. He is well-developed.  HENT:     Head: Normocephalic and atraumatic.  Eyes:     Conjunctiva/sclera: Conjunctivae normal.     Pupils: Pupils are equal, round, and reactive to light.  Neck:     Musculoskeletal: Normal range of motion and neck supple.  Cardiovascular:     Rate and Rhythm: Normal rate and regular rhythm.     Heart sounds: Normal heart sounds.  Pulmonary:     Effort: Pulmonary effort is normal. No respiratory distress.     Breath sounds: Normal breath sounds.  Abdominal:     General: There is no distension.     Palpations: Abdomen is soft.     Tenderness: There is no abdominal tenderness.  Musculoskeletal:        General: Tenderness  present. No deformity.     Comments: Moderate diffuse tenderness to the right elbow. Range of motion limited by pain.  Distal right upper extremity is neurovascular intact.  Skin:    General: Skin is warm and dry.  Neurological:     Mental Status: He is alert and oriented to person, place, and time.      ED Treatments / Results  Labs (all labs ordered are listed, but only abnormal results are displayed) Labs Reviewed - No data to display  EKG None  Radiology Dg Elbow Complete Right  Result Date: 09/02/2018 CLINICAL DATA:  Tripped and fell last night with elbow pain EXAM: RIGHT ELBOW - COMPLETE 3+ VIEW COMPARISON:  None. FINDINGS: There is an elbow joint effusion. There is a radial head fracture. This is essentially nondisplaced. No fracture of the humerus or ulna identified. IMPRESSION: Radial head fracture.  Joint effusion. Electronically Signed   By: Paulina Fusi M.D.   On: 09/02/2018 09:10   Dg Wrist Complete Left  Result Date: 09/02/2018 CLINICAL DATA:  Tripped and fell last night with wrist pain EXAM: LEFT WRIST - COMPLETE 3+ VIEW COMPARISON:  None. FINDINGS: There is no evidence of fracture or dislocation. There is no evidence of arthropathy or other focal bone abnormality. Soft tissues are unremarkable. IMPRESSION: Negative. Electronically Signed   By: Paulina Fusi M.D.   On: 09/02/2018 09:09    Procedures Procedures (including critical care time)  Medications Ordered in ED Medications - No data to display   Initial Impression / Assessment and Plan / ED Course  I have reviewed the triage vital signs and the nursing notes.  Pertinent labs & imaging results that were available during my care of the patient were reviewed by me and considered in my medical decision making (see chart for details).     MDM  Screen complete  Patient is presenting for evaluation of right elbow injury following fall.  Patient without evidence of other significant injury following his  fall.  Plain films of the right elbow demonstrate a right radial head fracture.  Nondisplaced.  Patient is improved following his ED evaluation.  Sling and splint applied.  Patient tolerated these procedures well.  Repeat exam after splint application shows neurovascular intact right upper extremity.  Patient does understand the need for close follow-up with orthopedics.  Strict return precautions given and understood.  Final Clinical Impressions(s) / ED Diagnoses   Final diagnoses:  Closed nondisplaced fracture of head of right radius, initial encounter    ED Discharge Orders    None  Wynetta FinesMessick, Kao Berkheimer C, MD 09/02/18 1124

## 2018-09-02 NOTE — ED Notes (Signed)
Ortho called for sling and arm splint

## 2018-09-02 NOTE — ED Triage Notes (Signed)
Per pt, states he was intoxicated and running sown the street last night-tripped and fell-having right elbow pain and left wrist pain

## 2018-09-02 NOTE — ED Notes (Signed)
Bed: WTR5 Expected date:  Expected time:  Means of arrival:  Comments: 

## 2018-09-03 ENCOUNTER — Emergency Department (HOSPITAL_COMMUNITY): Payer: BLUE CROSS/BLUE SHIELD

## 2018-09-03 ENCOUNTER — Emergency Department (HOSPITAL_COMMUNITY)
Admission: EM | Admit: 2018-09-03 | Discharge: 2018-09-03 | Disposition: A | Discharge status: Home / Self Care | Payer: BLUE CROSS/BLUE SHIELD | Attending: Emergency Medicine | Admitting: Emergency Medicine

## 2018-09-03 ENCOUNTER — Other Ambulatory Visit: Payer: Self-pay | Admitting: Family

## 2018-09-03 ENCOUNTER — Encounter (HOSPITAL_COMMUNITY): Payer: Self-pay

## 2018-09-03 ENCOUNTER — Telehealth (INDEPENDENT_AMBULATORY_CARE_PROVIDER_SITE_OTHER): Payer: Self-pay | Admitting: Orthopaedic Surgery

## 2018-09-03 DIAGNOSIS — W1830XA Fall on same level, unspecified, initial encounter: Secondary | ICD-10-CM | POA: Insufficient documentation

## 2018-09-03 DIAGNOSIS — S6992XA Unspecified injury of left wrist, hand and finger(s), initial encounter: Secondary | ICD-10-CM | POA: Diagnosis not present

## 2018-09-03 DIAGNOSIS — Y939 Activity, unspecified: Secondary | ICD-10-CM | POA: Diagnosis not present

## 2018-09-03 DIAGNOSIS — Y929 Unspecified place or not applicable: Secondary | ICD-10-CM | POA: Insufficient documentation

## 2018-09-03 DIAGNOSIS — S63502A Unspecified sprain of left wrist, initial encounter: Secondary | ICD-10-CM | POA: Insufficient documentation

## 2018-09-03 DIAGNOSIS — S63502D Unspecified sprain of left wrist, subsequent encounter: Secondary | ICD-10-CM

## 2018-09-03 DIAGNOSIS — Y999 Unspecified external cause status: Secondary | ICD-10-CM | POA: Insufficient documentation

## 2018-09-03 DIAGNOSIS — M25532 Pain in left wrist: Secondary | ICD-10-CM | POA: Diagnosis not present

## 2018-09-03 MED ORDER — BUPROPION HCL ER (XL) 300 MG PO TB24: 300.0000 mg | ORAL_TABLET | Freq: Every day | ORAL | 1 refills | Status: DC

## 2018-09-03 NOTE — ED Triage Notes (Signed)
Pt was here yesterday and had his right wrist xrayed and since he's been having to use his left wrist for everything he wants his left wrist xrayed

## 2018-09-03 NOTE — ED Provider Notes (Signed)
[facility]-EMERGENCY DEPT Provider Note   CSN: [id number] Arrival date & time: 09/03/18  1928     History   Chief Complaint Chief Complaint  Patient presents with  . left wrist pain    HPI Justin Burch is a 33 y.o. male with history of GERD who presents with left wrist pain after fall 2 nights ago.  Patient was initially evaluated that day and diagnosed with a radial head fracture on his right.  He reports his left wrist was hurting then and they x-rayed his right wrist instead.  He continues to have left wrist pain despite taking Tylenol and wearing a left wrist brace.  Patient is right-handed.  He has not yet followed up with orthopedic doctor.  He continues to wear splint and sling.  HPI  Past Medical History:  Diagnosis Date  . Alopecia   . Anal skin tag   . GERD (gastroesophageal reflux disease)   . Hemorrhoids   . History of transient ischemic attack (TIA) 2013   no residual per pt--  . Wears glasses     Patient Active Problem List   Diagnosis Date Noted  . Hair loss 11/16/2016  . Residual hemorrhoidal skin tags 11/16/2016  . Gastroesophageal reflux disease without esophagitis 11/16/2016    Past Surgical History:  Procedure Laterality Date  . EXCISION OF SKIN TAG N/A 01/18/2017   Procedure: EXCISION ANAL SKIN TAG, RECTAL EXAM UNDER ANESTHESIA;  Surgeon: Berna Bueonnor, Chelsea A, MD;  [facility] [place];  Service: General;  Laterality: N/A;  . HEMORROIDECTOMY    . NO PAST SURGERIES          Home Medications    Prior to Admission medications   Medication Sig Start Date End Date Taking? Authorizing Provider  buPROPion (WELLBUTRIN XL) 300 MG 24 hr tablet Take 1 tablet (300 mg total) by mouth daily. 09/03/18   Olive BassMurray, Laura Woodruff, FNP  emtricitabine-tenofovir AF (DESCOVY) 200-25 MG tablet Take 1 tablet by mouth daily. 08/20/18   Olive BassMurray, Laura Woodruff, FNP    Family History Family History  Problem Relation Age of Onset  .  Hypertension Mother   . Stroke Mother   . Ovarian cancer Mother   . Kidney disease Mother   . Healthy Father   . Lung cancer Maternal Grandmother   . Healthy Paternal Grandmother   . Colon cancer Paternal Grandfather     Social History Social History   Tobacco Use  . Smoking status: Never Smoker  . Smokeless tobacco: Never Used  Substance Use Topics  . Alcohol use: Yes    Comment: occasional  . Drug use: No     Allergies   Patient has no known allergies.   Review of Systems Review of Systems  Constitutional: Negative for chills and fever.  HENT: Negative for facial swelling and sore throat.   Respiratory: Negative for shortness of breath.   Cardiovascular: Negative for chest pain.  Gastrointestinal: Negative for abdominal pain, nausea and vomiting.  Genitourinary: Negative for dysuria.  Musculoskeletal: Positive for arthralgias and joint swelling. Negative for back pain.  Skin: Negative for rash and wound.  Neurological: Negative for headaches.  Psychiatric/Behavioral: The patient is not nervous/anxious.      Physical Exam Updated Vital Signs BP 128/88   Pulse 93   Temp 100 F (37.8 C) (Oral)   Resp 17   SpO2 99%   Physical Exam Vitals signs and nursing note reviewed.  Constitutional:      General: He is  not in acute distress.    Appearance: He is well-developed. He is not diaphoretic.  HENT:     Head: Normocephalic and atraumatic.     Mouth/Throat:     Pharynx: No oropharyngeal exudate.  Eyes:     General: No scleral icterus.       Right eye: No discharge.        Left eye: No discharge.     Conjunctiva/sclera: Conjunctivae normal.     Pupils: Pupils are equal, round, and reactive to light.  Neck:     Musculoskeletal: Normal range of motion and neck supple.     Thyroid: No thyromegaly.  Cardiovascular:     Rate and Rhythm: Normal rate and regular rhythm.     Heart sounds: Normal heart sounds. No murmur. No friction rub. No gallop.   Pulmonary:       Effort: Pulmonary effort is normal. No respiratory distress.     Breath sounds: Normal breath sounds. No stridor. No wheezing or rales.  Abdominal:     General: Bowel sounds are normal. There is no distension.     Palpations: Abdomen is soft.     Tenderness: There is no abdominal tenderness. There is no guarding or rebound.  Musculoskeletal:     Comments: Patient wearing splint and sling on his right arm No tenderness on palpation of the left wrist, no anatomical snuffbox tenderness; pain with wrist extension and flexion; sensation intact, cap refill less than 2 seconds, radial pulses intact  Lymphadenopathy:     Cervical: No cervical adenopathy.  Skin:    General: Skin is warm and dry.     Coloration: Skin is not pale.     Findings: No rash.  Neurological:     Mental Status: He is alert.     Coordination: Coordination normal.      ED Treatments / Results  Labs (all labs ordered are listed, but only abnormal results are displayed) Labs Reviewed - No data to display  EKG None  Radiology Dg Elbow Complete Right  Result Date: 09/02/2018 CLINICAL DATA:  Tripped and fell last night with elbow pain EXAM: RIGHT ELBOW - COMPLETE 3+ VIEW COMPARISON:  None. FINDINGS: There is an elbow joint effusion. There is a radial head fracture. This is essentially nondisplaced. No fracture of the humerus or ulna identified. IMPRESSION: Radial head fracture.  Joint effusion. Electronically Signed   By: Paulina FusiMark  Shogry M.D.   On: 09/02/2018 09:10   Dg Wrist Complete Left  Result Date: 09/03/2018 CLINICAL DATA:  Status post fall, left wrist pain EXAM: LEFT WRIST - COMPLETE 3+ VIEW COMPARISON:  None. FINDINGS: There is no evidence of fracture or dislocation. There is no evidence of arthropathy or other focal bone abnormality. Soft tissues are unremarkable. IMPRESSION: Negative. Electronically Signed   By: Elige KoHetal  Patel   On: 09/03/2018 21:27   Dg Wrist Complete Left  Result Date: 09/02/2018 CLINICAL  DATA:  Tripped and fell last night with wrist pain EXAM: LEFT WRIST - COMPLETE 3+ VIEW COMPARISON:  None. FINDINGS: There is no evidence of fracture or dislocation. There is no evidence of arthropathy or other focal bone abnormality. Soft tissues are unremarkable. IMPRESSION: Negative. Electronically Signed   By: Paulina FusiMark  Shogry M.D.   On: 09/02/2018 09:09    Procedures Procedures (including critical care time)  Medications Ordered in ED Medications - No data to display   Initial Impression / Assessment and Plan / ED Course  I have reviewed the triage vital signs and  the nursing notes.  Pertinent labs & imaging results that were available during my care of the patient were reviewed by me and considered in my medical decision making (see chart for details).     Patient presenting with recheck for left wrist.  His right wrist was x-rayed last time instead of his left wrist.  His left wrist x-ray today is negative.  Suspect sprain.  There is no anatomical snuffbox tenderness.  Patient will be given a more supportive Velcro brace that he has.  Patient is attempting to follow-up with orthopedics as planned for his radial head fracture.  Patient's temperature is 100.  He states he has an abscessed tooth that he is taking antibiotics and is already being followed for this.  He has no other signs of infection.  Return precautions discussed.  Patient understands and agrees with plan.  Patient vital stable throughout ED course and discharged in satisfactory condition.  Final Clinical Impressions(s) / ED Diagnoses   Final diagnoses:  Sprain of left wrist, subsequent encounter    ED Discharge Orders    None       Emi Holes, PA-C 09/03/18 2142    Mancel Bale, MD 09/03/18 2352

## 2018-09-03 NOTE — Telephone Encounter (Signed)
Should not need surgery , he can be worked in Friday for short visit. thanks

## 2018-09-03 NOTE — Telephone Encounter (Signed)
Patient called advised he went to [facility] ER 09/02/2018 and was seen for a closed nondisplaced Fx of head of Rt Radius. Patient said he was told to make an appointment with Dr. Ophelia Charter. The number to contact patient is [phone]

## 2018-09-03 NOTE — Telephone Encounter (Signed)
Can you look at this chart, tell me when patient would need to be seen?  If not this week with you, I can schedule with someone next week when you are out.

## 2018-09-03 NOTE — Discharge Instructions (Addendum)
Please follow-up with Dr. Ophelia Charter as planned.  You can alternate Tylenol and ibuprofen as needed for pain.  Use ice 3-4 times daily alternating 20 minutes on, 20 minutes off.  Wear your splint as needed.  Please return to the emergency department you develop any new or worsening symptoms.

## 2018-09-04 NOTE — Telephone Encounter (Signed)
IC patient and made appt for Friday.

## 2018-09-05 ENCOUNTER — Ambulatory Visit (INDEPENDENT_AMBULATORY_CARE_PROVIDER_SITE_OTHER): Payer: BLUE CROSS/BLUE SHIELD | Admitting: Orthopaedic Surgery

## 2018-09-05 ENCOUNTER — Encounter (INDEPENDENT_AMBULATORY_CARE_PROVIDER_SITE_OTHER): Payer: Self-pay | Admitting: Orthopaedic Surgery

## 2018-09-05 ENCOUNTER — Ambulatory Visit (INDEPENDENT_AMBULATORY_CARE_PROVIDER_SITE_OTHER): Payer: BLUE CROSS/BLUE SHIELD | Admitting: Emergency Medicine

## 2018-09-05 VITALS — BP 125/88 | HR 67 | Ht 69.0 in | Wt 175.0 lb

## 2018-09-05 DIAGNOSIS — E538 Deficiency of other specified B group vitamins: Secondary | ICD-10-CM | POA: Diagnosis not present

## 2018-09-05 DIAGNOSIS — S52124A Nondisplaced fracture of head of right radius, initial encounter for closed fracture: Secondary | ICD-10-CM

## 2018-09-05 MED ORDER — CYANOCOBALAMIN 1000 MCG/ML IJ SOLN
1000.0000 ug | Freq: Once | INTRAMUSCULAR | Status: AC
  Administered 2018-09-05: 1000 ug via INTRAMUSCULAR

## 2018-09-05 NOTE — Progress Notes (Signed)
Office Visit Note   Patient: Justin Burch           Date of Birth: 12/25/1985           MRN: [id number] Visit Date: 09/05/2018              Requested by: Olive Bass, FNP [address] [place], [place] [address] PCP: Olive Bass, FNP   Assessment & Plan: Visit Diagnoses:  1. Closed nondisplaced fracture of head of right radius, initial encounter     Plan: Conservative treatment recommended recheck 2 weeks work slip given.  No repeat x-ray needed on return visit.  X-ray results were reviewed with the patient outline plan discussed.  Follow-Up Instructions: Return in about 2 weeks (around 09/19/2018).   Orders:  No orders of the defined types were placed in this encounter.  No orders of the defined types were placed in this encounter.     Procedures: No procedures performed   Clinical Data: No additional findings.   Subjective: Chief Complaint  Patient presents with  . Left Elbow - Pain    HPI 33 year old male referred from ED with a fall 09/02/2018 with right radial head fracture treated in a splint and sling.  No past history of injury to his arm prior to that date.  He has a hip pain with using the arm no numbness tingling in his hand.  Negative for fever or chills.  X-ray was essentially nondisplaced.  Review of Systems positive history of reflux, hemorrhoids hair loss.  Otherwise negative is a pertains HPI.  For rheumatologic conditions CV respiratory negative.   Objective: Vital Signs: BP 125/88 (BP Location: Left Arm)   Pulse 67   Ht 5\' 9"  (1.753 m)   Wt 175 lb (79.4 kg)   BMI 25.84 kg/m   Physical Exam Constitutional:      Appearance: He is well-developed.  HENT:     Head: Normocephalic and atraumatic.  Eyes:     Pupils: Pupils are equal, round, and reactive to light.  Neck:     Thyroid: No thyromegaly.     Trachea: No tracheal deviation.  Cardiovascular:     Rate and Rhythm: Normal rate.  Pulmonary:     Effort:  Pulmonary effort is normal.     Breath sounds: No wheezing.  Abdominal:     General: Bowel sounds are normal.     Palpations: Abdomen is soft.  Skin:    General: Skin is warm and dry.     Capillary Refill: Capillary refill takes less than 2 seconds.  Neurological:     Mental Status: He is alert and oriented to person, place, and time.  Psychiatric:        Behavior: Behavior normal.        Thought Content: Thought content normal.        Judgment: Judgment normal.     Ortho Exam mild tenderness over the radial head mild effusion of the elbow.  Good shoulder range of motion sensation hand is intact no ecchymosis.  Specialty Comments:  No specialty comments available.  Imaging: No results found.   PMFS History: Patient Active Problem List   Diagnosis Date Noted  . Hair loss 11/16/2016  . Residual hemorrhoidal skin tags 11/16/2016  . Gastroesophageal reflux disease without esophagitis 11/16/2016   Past Medical History:  Diagnosis Date  . Alopecia   . Anal skin tag   . GERD (gastroesophageal reflux disease)   . Hemorrhoids   . History  of transient ischemic attack (TIA) 2013   no residual per pt--  . Wears glasses     Family History  Problem Relation Age of Onset  . Hypertension Mother   . Stroke Mother   . Ovarian cancer Mother   . Kidney disease Mother   . Healthy Father   . Lung cancer Maternal Grandmother   . Healthy Paternal Grandmother   . Colon cancer Paternal Grandfather     Past Surgical History:  Procedure Laterality Date  . EXCISION OF SKIN TAG N/A 01/18/2017   Procedure: EXCISION ANAL SKIN TAG, RECTAL EXAM UNDER ANESTHESIA;  Surgeon: Berna Bue, MD;  Location: [facility];  Service: General;  Laterality: N/A;  . HEMORROIDECTOMY    . NO PAST SURGERIES     Social History   Occupational History  . Occupation: Hospitality   Tobacco Use  . Smoking status: Never Smoker  . Smokeless tobacco: Never Used  Substance and Sexual  Activity  . Alcohol use: Yes    Comment: occasional  . Drug use: No  . Sexual activity: Not on file

## 2018-09-10 ENCOUNTER — Encounter (INDEPENDENT_AMBULATORY_CARE_PROVIDER_SITE_OTHER): Payer: Self-pay | Admitting: Orthopaedic Surgery

## 2018-09-10 DIAGNOSIS — S52124A Nondisplaced fracture of head of right radius, initial encounter for closed fracture: Secondary | ICD-10-CM | POA: Insufficient documentation

## 2018-09-12 ENCOUNTER — Ambulatory Visit (INDEPENDENT_AMBULATORY_CARE_PROVIDER_SITE_OTHER): Payer: BLUE CROSS/BLUE SHIELD

## 2018-09-12 DIAGNOSIS — E538 Deficiency of other specified B group vitamins: Secondary | ICD-10-CM | POA: Diagnosis not present

## 2018-09-12 MED ORDER — CYANOCOBALAMIN 1000 MCG/ML IJ SOLN
1000.0000 ug | Freq: Once | INTRAMUSCULAR | Status: AC
  Administered 2018-09-12: 1000 ug via INTRAMUSCULAR

## 2018-09-17 NOTE — Progress Notes (Signed)
Agree with treatment 

## 2018-09-19 ENCOUNTER — Ambulatory Visit: Payer: BLUE CROSS/BLUE SHIELD

## 2018-09-23 ENCOUNTER — Encounter (INDEPENDENT_AMBULATORY_CARE_PROVIDER_SITE_OTHER): Payer: Self-pay | Admitting: Orthopaedic Surgery

## 2018-09-23 ENCOUNTER — Ambulatory Visit (INDEPENDENT_AMBULATORY_CARE_PROVIDER_SITE_OTHER): Payer: BLUE CROSS/BLUE SHIELD | Admitting: Orthopaedic Surgery

## 2018-09-23 VITALS — BP 128/90 | HR 63 | Ht 69.0 in | Wt 175.0 lb

## 2018-09-23 DIAGNOSIS — S52124D Nondisplaced fracture of head of right radius, subsequent encounter for closed fracture with routine healing: Secondary | ICD-10-CM

## 2018-09-23 NOTE — Progress Notes (Signed)
Office Visit Note   Patient: Justin Burch           Date of Birth: 02-20-1986           MRN: [id number] Visit Date: 09/23/2018              Requested by: Olive Bass, FNP [address] [place], [place] [address] PCP: Olive Bass, FNP   Assessment & Plan: Visit Diagnoses:  1. Closed nondisplaced fracture of head of right radius with routine healing, subsequent encounter     Plan: Patient be released from care.  We given the additional exercises to work on with his upper arm laying flat on the counter and using his opposite hand for some active stretching.  He has functional range of motion of his elbow at this point but should be able to correct about half of the remaining deficit and get down to 5degree flexion contracture.  His flexion is excellent use of his arm is good he is happy the results of treatment and will work on trying to get a little bit more extension.  Return PRN.  He will call if he has any questions.  Follow-Up Instructions: Return if symptoms worsen or fail to improve.   Orders:  No orders of the defined types were placed in this encounter.  No orders of the defined types were placed in this encounter.     Procedures: No procedures performed   Clinical Data: No additional findings.   Subjective: Chief Complaint  Patient presents with  . Right Elbow - Fracture, Follow-up    Fall 09/02/2018    HPI 33 year old male returns for follow-up of right radial head fracture nondisplaced.  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  Review of Systems updated and unchanged from last office visit.   Objective: Vital Signs: BP 128/90   Pulse 63   Ht 5\' 9"  (1.753 m)   Wt 175 lb (79.4 kg)   BMI 25.84 kg/m   Physical Exam Constitutional:      Appearance: He is well-developed.  HENT:     Head: Normocephalic and atraumatic.  Eyes:     Pupils: Pupils are equal, round, and reactive to  light.  Neck:     Thyroid: No thyromegaly.     Trachea: No tracheal deviation.  Cardiovascular:     Rate and Rhythm: Normal rate.  Pulmonary:     Effort: Pulmonary effort is normal.     Breath sounds: No wheezing.  Abdominal:     General: Bowel sounds are normal.     Palpations: Abdomen is soft.  Skin:    General: Skin is warm and dry.     Capillary Refill: Capillary refill takes less than 2 seconds.  Neurological:     Mental Status: He is alert and oriented to person, place, and time.  Psychiatric:        Behavior: Behavior normal.        Thought Content: Thought content normal.        Judgment: Judgment normal.     Ortho Exam no elbow effusion.  He has full flexion.  Lacks 10 degrees reaching full extension.  No tenderness over the radial head and radial neck.  Sensation hand is intact distal RU joint is normal.  Specialty Comments:  No specialty comments available.  Imaging: No results found.   PMFS History: Patient Active Problem List   Diagnosis Date Noted  .  Closed nondisplaced fracture of head of right radius 09/10/2018  . Hair loss 11/16/2016  . Residual hemorrhoidal skin tags 11/16/2016  . Gastroesophageal reflux disease without esophagitis 11/16/2016   Past Medical History:  Diagnosis Date  . Alopecia   . Anal skin tag   . GERD (gastroesophageal reflux disease)   . Hemorrhoids   . History of transient ischemic attack (TIA) 2013   no residual per pt--  . Wears glasses     Family History  Problem Relation Age of Onset  . Hypertension Mother   . Stroke Mother   . Ovarian cancer Mother   . Kidney disease Mother   . Healthy Father   . Lung cancer Maternal Grandmother   . Healthy Paternal Grandmother   . Colon cancer Paternal Grandfather     Past Surgical History:  Procedure Laterality Date  . EXCISION OF SKIN TAG N/A 01/18/2017   Procedure: EXCISION ANAL SKIN TAG, RECTAL EXAM UNDER ANESTHESIA;  Surgeon: Berna Bue, MD;  [facility];  Service: General;  Laterality: N/A;  . HEMORROIDECTOMY    . NO PAST SURGERIES     Social History   Occupational History  . Occupation: Hospitality   Tobacco Use  . Smoking status: Never Smoker  . Smokeless tobacco: Never Used  Substance and Sexual Activity  . Alcohol use: Yes    Comment: occasional  . Drug use: No  . Sexual activity: Not on file

## 2018-10-03 ENCOUNTER — Ambulatory Visit: Payer: BLUE CROSS/BLUE SHIELD

## 2018-11-19 ENCOUNTER — Ambulatory Visit: Payer: BLUE CROSS/BLUE SHIELD | Admitting: Family

## 2018-11-24 ENCOUNTER — Encounter: Payer: Self-pay | Admitting: Family

## 2018-11-24 ENCOUNTER — Ambulatory Visit (INDEPENDENT_AMBULATORY_CARE_PROVIDER_SITE_OTHER): Payer: BLUE CROSS/BLUE SHIELD | Admitting: Family

## 2018-11-24 DIAGNOSIS — F329 Major depressive disorder, single episode, unspecified: Secondary | ICD-10-CM

## 2018-11-24 DIAGNOSIS — F32A Depression, unspecified: Secondary | ICD-10-CM

## 2018-11-24 DIAGNOSIS — E538 Deficiency of other specified B group vitamins: Secondary | ICD-10-CM | POA: Diagnosis not present

## 2018-11-24 DIAGNOSIS — Z79899 Other long term (current) drug therapy: Secondary | ICD-10-CM

## 2018-11-24 NOTE — Progress Notes (Signed)
Justin Burch is a 33 y.o. male with the following history as recorded in EpicCare:  Patient Active Problem List   Diagnosis Date Noted  . Closed nondisplaced fracture of head of right radius 09/10/2018  . Hair loss 11/16/2016  . Residual hemorrhoidal skin tags 11/16/2016  . Gastroesophageal reflux disease without esophagitis 11/16/2016    Current Outpatient Medications  Medication Sig Dispense Refill  . buPROPion (WELLBUTRIN XL) 300 MG 24 hr tablet Take 1 tablet (300 mg total) by mouth daily. 90 tablet 1  . emtricitabine-tenofovir AF (DESCOVY) 200-25 MG tablet Take 1 tablet by mouth daily. 30 tablet 2   No current facility-administered medications for this visit.     Allergies: Patient has no known allergies.  Past Medical History:  Diagnosis Date  . Alopecia   . Anal skin tag   . GERD (gastroesophageal reflux disease)   . Hemorrhoids   . History of transient ischemic attack (TIA) 2013   no residual per pt--  . Wears glasses     Past Surgical History:  Procedure Laterality Date  . EXCISION OF SKIN TAG N/A 01/18/2017   Procedure: EXCISION ANAL SKIN TAG, RECTAL EXAM UNDER ANESTHESIA;  Surgeon: Berna Bueonnor, Chelsea A, MD;  [facility];  Service: General;  Laterality: N/A;  . HEMORROIDECTOMY    . NO PAST SURGERIES      Family History  Problem Relation Age of Onset  . Hypertension Mother   . Stroke Mother   . Ovarian cancer Mother   . Kidney disease Mother   . Healthy Father   . Lung cancer Maternal Grandmother   . Healthy Paternal Grandmother   . Colon cancer Paternal Grandfather     Social History   Tobacco Use  . Smoking status: Never Smoker  . Smokeless tobacco: Never Used  Substance Use Topics  . Alcohol use: Yes    Comment: occasional    Subjective:   I connected with Justin GulaMatthew Cacho on 11/24/18 at  1:40 PM EDT by a video enabled telemedicine application and verified that I am speaking with the correct person using two identifiers.   I  discussed the limitations of evaluation and management by telemedicine and the availability of in person appointments. The patient expressed understanding and agreed to proceed. Patient and I are the only 2 people on the call.   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  General: Well developed, well nourished, in no acute distress  Head: Normocephalic and atraumatic  Lungs: Respirations unlabored;  Neurologic: Alert and oriented; speech intact; face symmetrical;    Assessment:  1. B12 deficiency   2. High risk medication use   3. Depression, unspecified depression type     Plan:  1. Patient will come tomorrow for labs including CBC, B12; follow-up to be determined. 2. Will update labs for Descovy; assuming normal, will refill medication for 3 months; 3. Stable; continue Wellbutrin XL 300 mg  daily;   No follow-ups on file.  No orders of the defined types were placed in this encounter.   Requested Prescriptions    No prescriptions requested or ordered in this encounter

## 2018-11-26 ENCOUNTER — Other Ambulatory Visit: Payer: Self-pay | Admitting: Family

## 2018-11-26 NOTE — Telephone Encounter (Signed)
Sent patient a message today via my-chart.

## 2018-11-26 NOTE — Telephone Encounter (Signed)
Please remind him that I can't fill the Descovy without updated labs as we discussed on Monday. Orders are in place.

## 2018-11-28 NOTE — Telephone Encounter (Signed)
Message read by patient on yesterday.

## 2018-12-02 ENCOUNTER — Other Ambulatory Visit: Payer: Self-pay | Admitting: Family

## 2018-12-02 ENCOUNTER — Telehealth: Payer: Self-pay | Admitting: Family

## 2018-12-02 NOTE — Telephone Encounter (Signed)
Called and left patient a message with info. Sent him my chart message that he read as well regarding coming by for labs before Descovy could be refilled again

## 2018-12-02 NOTE — Telephone Encounter (Signed)
Please remind him that we cannot fill the Descovy without updated labs; orders are in place for him to come get these done.

## 2019-01-12 DIAGNOSIS — H04123 Dry eye syndrome of bilateral lacrimal glands: Secondary | ICD-10-CM | POA: Diagnosis not present

## 2019-04-30 ENCOUNTER — Other Ambulatory Visit: Payer: Self-pay | Admitting: Emergency Medicine

## 2019-04-30 DIAGNOSIS — Z20822 Contact with and (suspected) exposure to covid-19: Secondary | ICD-10-CM

## 2019-05-01 LAB — NOVEL CORONAVIRUS, NAA: SARS-CoV-2, NAA: NOT DETECTED

## 2019-05-06 ENCOUNTER — Other Ambulatory Visit: Payer: Self-pay

## 2019-05-06 DIAGNOSIS — Z20822 Contact with and (suspected) exposure to covid-19: Secondary | ICD-10-CM

## 2019-05-08 LAB — NOVEL CORONAVIRUS, NAA: SARS-CoV-2, NAA: NOT DETECTED

## 2019-10-02 ENCOUNTER — Encounter: Payer: Self-pay | Admitting: Family

## 2019-10-02 ENCOUNTER — Ambulatory Visit: Payer: 59 | Admitting: Family

## 2019-10-02 ENCOUNTER — Other Ambulatory Visit: Payer: Self-pay

## 2019-10-02 VITALS — BP 110/80 | HR 68 | Temp 98.0°F | Ht 69.0 in | Wt 207.4 lb

## 2019-10-02 DIAGNOSIS — E538 Deficiency of other specified B group vitamins: Secondary | ICD-10-CM | POA: Diagnosis not present

## 2019-10-02 DIAGNOSIS — Z79899 Other long term (current) drug therapy: Secondary | ICD-10-CM | POA: Diagnosis not present

## 2019-10-02 DIAGNOSIS — F329 Major depressive disorder, single episode, unspecified: Secondary | ICD-10-CM

## 2019-10-02 DIAGNOSIS — Z113 Encounter for screening for infections with a predominantly sexual mode of transmission: Secondary | ICD-10-CM

## 2019-10-02 DIAGNOSIS — F32A Depression, unspecified: Secondary | ICD-10-CM

## 2019-10-02 LAB — COMPREHENSIVE METABOLIC PANEL
ALT: 18 U/L (ref 0–53)
AST: 22 U/L (ref 0–37)
Albumin: 4.6 g/dL (ref 3.5–5.2)
Alkaline Phosphatase: 82 U/L (ref 39–117)
BUN: 17 mg/dL (ref 6–23)
CO2: 28 mEq/L (ref 19–32)
Calcium: 9.8 mg/dL (ref 8.4–10.5)
Chloride: 102 mEq/L (ref 96–112)
Creatinine, Ser: 1.25 mg/dL (ref 0.40–1.50)
GFR: 66.63 mL/min (ref >60.00)
Glucose, Bld: 82 mg/dL (ref 70–99)
Potassium: 4 mEq/L (ref 3.5–5.1)
Sodium: 138 mEq/L (ref 135–145)
Total Bilirubin: 0.9 mg/dL (ref 0.2–1.2)
Total Protein: 7.8 g/dL (ref 6.0–8.3)

## 2019-10-02 LAB — CBC WITH DIFFERENTIAL/PLATELET
Basophils Absolute: 0 10*3/uL (ref 0.0–0.1)
Basophils Relative: 0.8 % (ref 0.0–3.0)
Eosinophils Absolute: 0.2 10*3/uL (ref 0.0–0.7)
Eosinophils Relative: 3.4 % (ref 0.0–5.0)
HCT: 47.9 % (ref 39.0–52.0)
Hemoglobin: 16.5 g/dL (ref 13.0–17.0)
Lymphocytes Relative: 35.5 % (ref 12.0–46.0)
Lymphs Abs: 2 10*3/uL (ref 0.7–4.0)
MCHC: 34.4 g/dL (ref 30.0–36.0)
MCV: 86.2 fl (ref 78.0–100.0)
Monocytes Absolute: 0.7 10*3/uL (ref 0.1–1.0)
Monocytes Relative: 12.5 % — ABNORMAL HIGH (ref 3.0–12.0)
Neutro Abs: 2.7 10*3/uL (ref 1.4–7.7)
Neutrophils Relative %: 47.8 % (ref 43.0–77.0)
Platelets: 265 10*3/uL (ref 150.0–400.0)
RBC: 5.56 Mil/uL (ref 4.22–5.81)
RDW: 13.1 % (ref 11.5–15.5)
WBC: 5.6 10*3/uL (ref 4.0–10.5)

## 2019-10-02 LAB — VITAMIN B12: Vitamin B-12: 583 pg/mL (ref 211–911)

## 2019-10-02 MED ORDER — BUPROPION HCL ER (XL) 150 MG PO TB24: ORAL_TABLET | ORAL | 0 refills | Status: DC

## 2019-10-02 NOTE — Progress Notes (Signed)
Justin Burch is a 34 y.o. male with the following history as recorded in EpicCare:  Patient Active Problem List   Diagnosis Date Noted  . Closed nondisplaced fracture of head of right radius 09/10/2018  . Hair loss 11/16/2016  . Residual hemorrhoidal skin tags 11/16/2016  . Gastroesophageal reflux disease without esophagitis 11/16/2016    Current Outpatient Medications  Medication Sig Dispense Refill  . buPROPion (WELLBUTRIN XL) 150 MG 24 hr tablet Take 1 tablet daily x 1 week; increase to 2 per day as directed 60 tablet 0  . buPROPion (WELLBUTRIN XL) 300 MG 24 hr tablet Take 1 tablet (300 mg total) by mouth daily. (Patient not taking: Reported on 10/02/2019) 90 tablet 1  . emtricitabine-tenofovir AF (DESCOVY) 200-25 MG tablet Take 1 tablet by mouth daily. (Patient not taking: Reported on 10/02/2019) 30 tablet 2   No current facility-administered medications for this visit.    Allergies: Patient has no known allergies.  Past Medical History:  Diagnosis Date  . Alopecia   . Anal skin tag   . GERD (gastroesophageal reflux disease)   . Hemorrhoids   . History of transient ischemic attack (TIA) 2013   no residual per pt--  . Wears glasses     Past Surgical History:  Procedure Laterality Date  . EXCISION OF SKIN TAG N/A 01/18/2017   Procedure: EXCISION ANAL SKIN TAG, RECTAL EXAM UNDER ANESTHESIA;  Surgeon: Clovis Riley, MD;  Location: [facility];  Service: General;  Laterality: N/A;  . HEMORROIDECTOMY    . NO PAST SURGERIES      Family History  Problem Relation Age of Onset  . Hypertension Mother   . Stroke Mother   . Ovarian cancer Mother   . Kidney disease Mother   . Healthy Father   . Lung cancer Maternal Grandmother   . Healthy Paternal Grandmother   . Colon cancer Paternal Grandfather     Social History   Tobacco Use  . Smoking status: Never Smoker  . Smokeless tobacco: Never Used  Substance Use Topics  . Alcohol use: Yes    Comment: occasional     Subjective:  Has not been seen in over a year due to lost insurance; insurance is now back in place and would like to get back on his Wellbutrin and Descovy; has been taking OTC B12 supplements.   Agreeable to getting his labs updated today;   Objective:  Vitals:   10/02/19 0905  BP: 110/80  Pulse: 68  Temp: 98 F (36.7 C)  TempSrc: Oral  SpO2: 97%  Weight: 207 lb 6.4 oz (94.1 kg)  Height: '5\' 9"'  (1.753 m)    General: Well developed, well nourished, in no acute distress  Skin : Warm and dry.  Head: Normocephalic and atraumatic  Lungs: Respirations unlabored; clear to auscultation bilaterally without wheeze, rales, rhonchi  CVS exam: normal rate and regular rhythm.  Neurologic: Alert and oriented; speech intact; face symmetrical; moves all extremities well; CNII-XII intact without focal deficit   Assessment:  1. High risk medication use   2. B12 deficiency   3. Screen for STD (sexually transmitted disease)   4. Depression, unspecified depression type     Plan:  Will plan to prescribe Descovy once labs are up to date; Re-start Wellbutrin XL 150 mg daily- plan to increase to 300 mg daily; Check B12 level today;  This visit occurred during the SARS-CoV-2 public health emergency.  Safety protocols were in place, including screening questions prior to  the visit, additional usage of staff PPE, and extensive cleaning of exam room while observing appropriate contact time as indicated for disinfecting solutions.     No follow-ups on file.  Orders Placed This Encounter  Procedures  . GC/Chlamydia Probe Amp([facility])  . Hepatitis B Surface AntiGEN  . CBC w/Diff  . B12  . HIV antibody (with reflex)  . RPR  . Comp Met (CMET)  . GC probe amplification, urine    Requested Prescriptions   Signed Prescriptions Disp Refills  . buPROPion (WELLBUTRIN XL) 150 MG 24 hr tablet 60 tablet 0    Sig: Take 1 tablet daily x 1 week; increase to 2 per day as directed

## 2019-10-04 ENCOUNTER — Ambulatory Visit: Payer: 59 | Attending: Internal Medicine

## 2019-10-04 DIAGNOSIS — Z23 Encounter for immunization: Secondary | ICD-10-CM

## 2019-10-04 NOTE — Progress Notes (Signed)
   Covid-19 Vaccination Clinic  Name:  Justin Burch    MRN: [id number] DOB: [birth year]  10/04/2019  [name] was observed post Covid-19 immunization for 15 minutes without incident. He was provided with Vaccine Information Sheet and instruction to access the V-Safe system.   [name] was instructed to call 911 with any severe reactions post vaccine: Marland Kitchen Difficulty breathing  . Swelling of face and throat  . A fast heartbeat  . A bad rash all over body  . Dizziness and weakness   Immunizations Administered    Name Date Dose VIS Date Route   Pfizer COVID-19 Vaccine 10/04/2019  9:30 AM 0.3 mL 07/10/2019 Intramuscular   Manufacturer: [employer], [employer]   Lot: [id number]   NDC: [address]-2

## 2019-10-05 LAB — RPR: RPR Ser Ql: NONREACTIVE

## 2019-10-05 LAB — HIV ANTIBODY (ROUTINE TESTING W REFLEX): HIV 1&2 Ab, 4th Generation: NONREACTIVE

## 2019-10-05 LAB — HEPATITIS B SURFACE ANTIGEN: Hepatitis B Surface Ag: NONREACTIVE

## 2019-10-06 ENCOUNTER — Other Ambulatory Visit: Payer: Self-pay | Admitting: Family

## 2019-10-06 ENCOUNTER — Encounter: Payer: Self-pay | Admitting: Family

## 2019-10-06 LAB — GC/CHLAMYDIA PROBE AMP
Chlamydia trachomatis, NAA: NEGATIVE
Neisseria Gonorrhoeae by PCR: NEGATIVE

## 2019-10-06 MED ORDER — DESCOVY 200-25 MG PO TABS: 1.0000 | ORAL_TABLET | Freq: Every day | ORAL | 2 refills | Status: DC

## 2019-10-23 ENCOUNTER — Telehealth: Payer: Self-pay

## 2019-10-23 NOTE — Telephone Encounter (Signed)
Do you still have the PA form? I think they gave the name of a medication they would pay for.  No, we will not be doing an appeal.

## 2019-10-23 NOTE — Telephone Encounter (Signed)
[facility] calling and states that the PA for Descovy was denied. States that they will place it on hold and if Vernona Rieger would like to appeal the decision she can; just let them know if that is done. CB#: [phone]

## 2019-10-26 ENCOUNTER — Telehealth: Payer: Self-pay | Admitting: Family

## 2019-10-26 MED ORDER — EMTRICITABINE-TENOFOVIR DF 200-300 MG PO TABS: 1.0000 | ORAL_TABLET | Freq: Every day | ORAL | 2 refills | Status: DC

## 2019-10-26 NOTE — Telephone Encounter (Signed)
I think his insurance will pay for Truvada insteady of Descovy; I am going to send that in for him. It is still once a day.

## 2019-10-26 NOTE — Telephone Encounter (Signed)
Message left for patient today with info 

## 2019-11-03 ENCOUNTER — Ambulatory Visit: Payer: 59 | Attending: Internal Medicine

## 2019-11-03 DIAGNOSIS — Z23 Encounter for immunization: Secondary | ICD-10-CM

## 2019-11-03 NOTE — Progress Notes (Signed)
   Covid-19 Vaccination Clinic  Name:  Justin Burch    MRN: [id number] DOB: 1986/01/18  11/03/2019  Mr. Justin Burch was observed post Covid-19 immunization for 15 minutes without incident. He was provided with Vaccine Information Sheet and instruction to access the V-Safe system.   Mr. Justin Burch was instructed to call 911 with any severe reactions post vaccine: Marland Kitchen Difficulty breathing  . Swelling of face and throat  . A fast heartbeat  . A bad rash all over body  . Dizziness and weakness   Immunizations Administered    Name Date Dose VIS Date Route   Pfizer COVID-19 Vaccine 11/03/2019 10:43 AM 0.3 mL 07/10/2019 Intramuscular   Manufacturer: [employer], [employer]   Lot: [id number]   NDC: [address]-7

## 2019-11-18 ENCOUNTER — Encounter: Payer: Self-pay | Admitting: Family

## 2019-11-18 ENCOUNTER — Ambulatory Visit: Payer: 59 | Admitting: Family

## 2019-11-18 ENCOUNTER — Ambulatory Visit (INDEPENDENT_AMBULATORY_CARE_PROVIDER_SITE_OTHER): Payer: 59

## 2019-11-18 ENCOUNTER — Other Ambulatory Visit: Payer: Self-pay

## 2019-11-18 VITALS — BP 102/70 | HR 69 | Temp 98.4°F | Wt 196.8 lb

## 2019-11-18 DIAGNOSIS — M25531 Pain in right wrist: Secondary | ICD-10-CM

## 2019-11-18 DIAGNOSIS — R41840 Attention and concentration deficit: Secondary | ICD-10-CM | POA: Diagnosis not present

## 2019-11-18 NOTE — Progress Notes (Signed)
Justin Burch is a 34 y.o. male with the following history as recorded in EpicCare:  Patient Active Problem List   Diagnosis Date Noted  . Closed nondisplaced fracture of head of right radius 09/10/2018  . Hair loss 11/16/2016  . Residual hemorrhoidal skin tags 11/16/2016  . Gastroesophageal reflux disease without esophagitis 11/16/2016    No current outpatient medications on file.   No current facility-administered medications for this visit.    Allergies: Patient has no known allergies.  Past Medical History:  Diagnosis Date  . Alopecia   . Anal skin tag   . GERD (gastroesophageal reflux disease)   . Hemorrhoids   . History of transient ischemic attack (TIA) 2013   no residual per pt--  . Wears glasses     Past Surgical History:  Procedure Laterality Date  . EXCISION OF SKIN TAG N/A 01/18/2017   Procedure: EXCISION ANAL SKIN TAG, RECTAL EXAM UNDER ANESTHESIA;  Surgeon: Berna Bue, MD;  Location: [facility];  Service: General;  Laterality: N/A;  . HEMORROIDECTOMY    . NO PAST SURGERIES      Family History  Problem Relation Age of Onset  . Hypertension Mother   . Stroke Mother   . Ovarian cancer Mother   . Kidney disease Mother   . Healthy Father   . Lung cancer Maternal Grandmother   . Healthy Paternal Grandmother   . Colon cancer Paternal Grandfather     Social History   Tobacco Use  . Smoking status: Never Smoker  . Smokeless tobacco: Never Used  Substance Use Topics  . Alcohol use: Yes    Comment: occasional    Subjective:  Patient has complaints of right wrist pain; did actually break the right wrist in 08/2018- was under the care of orthopedics;  Would also like to discuss stopping Wellbutrin XL- has actually stopped on his own in the past 2 weeks; feels like he has underlying ADD and would to get tested/ consider trial of Vyvanse;     Objective:  Vitals:   11/18/19 1110  BP: 102/70  Pulse: 69  Temp: 98.4 F (36.9 C)   TempSrc: Oral  SpO2: 96%  Weight: 196 lb 12.8 oz (89.3 kg)    General: Well developed, well nourished, in no acute distress  Skin : Warm and dry.  Head: Normocephalic and atraumatic  Eyes: Sclera and conjunctiva clear; pupils round and reactive to light; extraocular movements intact  Ears: External normal; canals clear; tympanic membranes normal  Oropharynx: Pink, supple. No suspicious lesions  Neck: Supple without thyromegaly, adenopathy  Lungs: Respirations unlabored; clear to auscultation bilaterally without wheeze, rales, rhonchi  Musculoskeletal: No deformities; no active joint inflammation  Extremities: No edema, cyanosis, clubbing  Vessels: Symmetric bilaterally  Neurologic: Alert and oriented; speech intact; face symmetrical; moves all extremities well; CNII-XII intact without focal deficit   Assessment:  1. Right wrist pain   2. Attention deficit     Plan:  1. Update right wrist X-ray; refer back to orthopedics; ? Carpal tunnel changes;  2. Patient is given contact information for getting tested completed- can consider Vyvanse if testing indicates medication is appropriate. 3. Patient will contact his health insurance to see what medication for PREP treatment is covered since they will not pay for Descovy or Truvada.  This visit occurred during the SARS-CoV-2 public health emergency.  Safety protocols were in place, including screening questions prior to the visit, additional usage of staff PPE, and extensive cleaning of exam  room while observing appropriate contact time as indicated for disinfecting solutions.     No follow-ups on file.  Orders Placed This Encounter  Procedures  . DG Wrist Complete Right    Standing Status:   Future    Number of Occurrences:   1    Standing Expiration Date:   01/17/2021    Order Specific Question:   Reason for Exam (SYMPTOM  OR DIAGNOSIS REQUIRED)    Answer:   right wrist xray    Order Specific Question:   Preferred imaging location?     Answer:   [facility]    Order Specific Question:   Radiology Contrast Protocol - do NOT remove file path    Answer:   [email or web address]  . Ambulatory referral to Orthopedic Surgery    Referral Priority:   Routine    Referral Type:   Surgical    Referral Reason:   Specialty Services Required    Referred to Provider:   Marybelle Killings, MD    Requested Specialty:   Orthopedic Surgery    Number of Visits Requested:   1  . Ambulatory referral to Psychology    Referral Priority:   Routine    Referral Type:   Psychiatric    Referral Reason:   Specialty Services Required    Requested Specialty:   Psychology    Number of Visits Requested:   1    Requested Prescriptions    No prescriptions requested or ordered in this encounter

## 2019-11-18 NOTE — Patient Instructions (Addendum)
[email or web address]    [facility] [phone]

## 2019-11-20 ENCOUNTER — Encounter: Payer: Self-pay | Admitting: Family

## 2019-11-23 ENCOUNTER — Other Ambulatory Visit: Payer: Self-pay | Admitting: Family

## 2019-11-23 MED ORDER — LISDEXAMFETAMINE DIMESYLATE 30 MG PO CAPS: 30.0000 mg | ORAL_CAPSULE | Freq: Every day | ORAL | 0 refills | Status: DC

## 2019-12-01 ENCOUNTER — Encounter: Payer: Self-pay | Admitting: Orthopaedic Surgery

## 2019-12-01 ENCOUNTER — Other Ambulatory Visit: Payer: Self-pay

## 2019-12-01 ENCOUNTER — Ambulatory Visit (INDEPENDENT_AMBULATORY_CARE_PROVIDER_SITE_OTHER): Payer: 59 | Admitting: Orthopaedic Surgery

## 2019-12-01 DIAGNOSIS — M778 Other enthesopathies, not elsewhere classified: Secondary | ICD-10-CM | POA: Insufficient documentation

## 2019-12-01 NOTE — Progress Notes (Signed)
Office Visit Note   Patient: Justin Burch           Date of Birth: Oct 02, 1985           MRN: [id number] Visit Date: 12/01/2019              Requested by: Olive Bass, FNP [address] [place],  [place] [address] PCP: Olive Bass, FNP   Assessment & Plan: Visit Diagnoses:  1. Right wrist tendinitis     Plan: Patient has right wrist ECU tendinopathy.  Splint applied he will use it as much as he is able to.  We discussed 2 Aleve p.o. twice daily with food caution GI problems which we discussed in detail.  He can use some Aspercreme over the area and return if he has persistent problems.  Follow-Up Instructions: if symptoms persist or worsen.   Orders:  No orders of the defined types were placed in this encounter.  No orders of the defined types were placed in this encounter.     Procedures: No procedures performed   Clinical Data: No additional findings.   Subjective: Chief Complaint  Patient presents with  . Right Wrist - Pain    HPI 34 year old male works at [employer].  He works as a Production assistant, radio and states he injured his wrist when he is doing yoga with pressors and wrist and maximum supination position with shoulders internally rotated.  He has had pain over the ulnar aspect of the wrist pain with carrying plates grabbing and pushing.  He is not take any medications no past history of injury to the wrist but he did have a history of a nondisplaced radial head fracture which we reviewed previous images at today's visit.  Patient denies associated neck pain no chills or fever no rheumatologic conditions.  Review of Systems review of systems noncontributory as pertains HPI 14 point systems.  Previous nondisplaced radial head fracture healed.   Objective: Vital Signs: BP (!) 135/95   Pulse 67   Ht 5\' 9"  (1.753 m)   Wt 190 lb (86.2 kg)   BMI 28.06 kg/m   Physical Exam Constitutional:      Appearance: He is  well-developed.  HENT:     Head: Normocephalic and atraumatic.  Eyes:     Pupils: Pupils are equal, round, and reactive to light.  Neck:     Thyroid: No thyromegaly.     Trachea: No tracheal deviation.  Cardiovascular:     Rate and Rhythm: Normal rate.  Pulmonary:     Effort: Pulmonary effort is normal.     Breath sounds: No wheezing.  Abdominal:     General: Bowel sounds are normal.     Palpations: Abdomen is soft.  Skin:    General: Skin is warm and dry.     Capillary Refill: Capillary refill takes less than 2 seconds.  Neurological:     Mental Status: He is alert and oriented to person, place, and time.  Psychiatric:        Behavior: Behavior normal.        Thought Content: Thought content normal.        Judgment: Judgment normal.     Ortho Exam full elbow range of motion full supination pronation no elbow effusion.  Is tenderness over the extensor carpi ulnaris tendon at the wrist and pain with resisted ECU function which reproduces his pain.  Scaphoid tuberosity other dorsal compartments are normal.  Carpal tunnel exam  is negative pulses are normal good capillary refill sensation is intact ulnar nerve at the elbow is normal.  No brachial plexus tenderness right or left.  Specialty Comments:  No specialty comments available.  Imaging: Outside x-rays from PCP reviewed which are negative for acute changes right wrist   PMFS History: Patient Active Problem List   Diagnosis Date Noted  . Right wrist tendinitis 12/01/2019  . Closed nondisplaced fracture of head of right radius 09/10/2018  . Hair loss 11/16/2016  . Residual hemorrhoidal skin tags 11/16/2016  . Gastroesophageal reflux disease without esophagitis 11/16/2016   Past Medical History:  Diagnosis Date  . Alopecia   . Anal skin tag   . GERD (gastroesophageal reflux disease)   . Hemorrhoids   . History of transient ischemic attack (TIA) 2013   no residual per pt--  . Wears glasses     Family History    Problem Relation Age of Onset  . Hypertension Mother   . Stroke Mother   . Ovarian cancer Mother   . Kidney disease Mother   . Healthy Father   . Lung cancer Maternal Grandmother   . Healthy Paternal Grandmother   . Colon cancer Paternal Grandfather     Past Surgical History:  Procedure Laterality Date  . EXCISION OF SKIN TAG N/A 01/18/2017   Procedure: EXCISION ANAL SKIN TAG, RECTAL EXAM UNDER ANESTHESIA;  Surgeon: Clovis Riley, MD;  Location: [facility];  Service: General;  Laterality: N/A;  . HEMORROIDECTOMY    . NO PAST SURGERIES     Social History   Occupational History  . Occupation: Hospitality   Tobacco Use  . Smoking status: Never Smoker  . Smokeless tobacco: Never Used  Substance and Sexual Activity  . Alcohol use: Yes    Comment: occasional  . Drug use: No  . Sexual activity: Not on file

## 2019-12-21 ENCOUNTER — Ambulatory Visit: Payer: 59 | Admitting: Family

## 2019-12-23 ENCOUNTER — Encounter: Payer: Self-pay | Admitting: Family

## 2019-12-23 ENCOUNTER — Ambulatory Visit: Payer: 59 | Admitting: Family

## 2019-12-23 ENCOUNTER — Other Ambulatory Visit: Payer: Self-pay

## 2019-12-23 VITALS — BP 108/78 | HR 67 | Temp 98.4°F | Ht 69.0 in | Wt 192.8 lb

## 2019-12-23 DIAGNOSIS — Z79899 Other long term (current) drug therapy: Secondary | ICD-10-CM | POA: Diagnosis not present

## 2019-12-23 DIAGNOSIS — F988 Other specified behavioral and emotional disorders with onset usually occurring in childhood and adolescence: Secondary | ICD-10-CM

## 2019-12-23 LAB — CBC WITH DIFFERENTIAL/PLATELET
Basophils Absolute: 0.1 10*3/uL (ref 0.0–0.1)
Basophils Relative: 1.2 % (ref 0.0–3.0)
Eosinophils Absolute: 0.2 10*3/uL (ref 0.0–0.7)
Eosinophils Relative: 3.5 % (ref 0.0–5.0)
HCT: 49.3 % (ref 39.0–52.0)
Hemoglobin: 16.7 g/dL (ref 13.0–17.0)
Lymphocytes Relative: 41.1 % (ref 12.0–46.0)
Lymphs Abs: 2.1 10*3/uL (ref 0.7–4.0)
MCHC: 33.9 g/dL (ref 30.0–36.0)
MCV: 87.6 fl (ref 78.0–100.0)
Monocytes Absolute: 0.6 10*3/uL (ref 0.1–1.0)
Monocytes Relative: 11.6 % (ref 3.0–12.0)
Neutro Abs: 2.1 10*3/uL (ref 1.4–7.7)
Neutrophils Relative %: 42.6 % — ABNORMAL LOW (ref 43.0–77.0)
Platelets: 257 10*3/uL (ref 150.0–400.0)
RBC: 5.62 Mil/uL (ref 4.22–5.81)
RDW: 13.1 % (ref 11.5–15.5)
WBC: 5 10*3/uL (ref 4.0–10.5)

## 2019-12-23 LAB — COMPREHENSIVE METABOLIC PANEL
ALT: 13 U/L (ref 0–53)
AST: 16 U/L (ref 0–37)
Albumin: 4.6 g/dL (ref 3.5–5.2)
Alkaline Phosphatase: 72 U/L (ref 39–117)
BUN: 9 mg/dL (ref 6–23)
CO2: 30 mEq/L (ref 19–32)
Calcium: 9.2 mg/dL (ref 8.4–10.5)
Chloride: 102 mEq/L (ref 96–112)
Creatinine, Ser: 1.18 mg/dL (ref 0.40–1.50)
GFR: 71.11 mL/min (ref >60.00)
Glucose, Bld: 89 mg/dL (ref 70–99)
Potassium: 4 mEq/L (ref 3.5–5.1)
Sodium: 136 mEq/L (ref 135–145)
Total Bilirubin: 0.5 mg/dL (ref 0.2–1.2)
Total Protein: 7.1 g/dL (ref 6.0–8.3)

## 2019-12-23 LAB — TIQ-MISC

## 2019-12-23 MED ORDER — LISDEXAMFETAMINE DIMESYLATE 40 MG PO CAPS: 40.0000 mg | ORAL_CAPSULE | Freq: Every day | ORAL | 0 refills | Status: DC

## 2019-12-23 NOTE — Progress Notes (Signed)
ADD Note  Patient Active Problem List   Diagnosis Date Noted  . Right wrist tendinitis 12/01/2019  . Closed nondisplaced fracture of head of right radius 09/10/2018  . Hair loss 11/16/2016  . Residual hemorrhoidal skin tags 11/16/2016  . Gastroesophageal reflux disease without esophagitis 11/16/2016    Subjective:  Justin Burch presents in follow up of ADD. He is in his baseline state of health. He continues to tolerate his medicine without adverse effect. He denies palpitation, chest discomfort, appetite or weight changes. No labile mood, relationship with friends, family, coworkers is good. The medicine continues to help with concentrate and focus. He takes a break from the medication on days off. He does feel that the dosage needs to be increased.   Objective:  Vitals:   12/23/19 1157  BP: 108/78  Pulse: 67  Temp: 98.4 F (36.9 C)  TempSrc: Oral  SpO2: 97%  Weight: 192 lb 12.8 oz (87.5 kg)  Height: 5\' 9"  (1.753 m)    General: Well developed, well nourished, in no acute distress  Lungs: Respirations unlabored; clear to auscultation bilaterally without wheeze, rales, rhonchi  Cardiovascular: Regular, rate, and rhythm without murmurs, gallops, or rubs  Neurologic: Alert and oriented; speech intact; face symmetrical; moves all extremities well  Psych: Appropriate affect, clear thought process, demonstrates appropriate judgement   Assessment:  1. Attention deficit disorder, unspecified hyperactivity presence   2. High risk medication use     Plan:  Symptoms are stable. Increase dosage to 40 mg; call back with response in 1 month;  This visit occurred during the SARS-CoV-2 public health emergency.  Safety protocols were in place, including screening questions prior to the visit, additional usage of staff PPE, and extensive cleaning of exam room while observing appropriate contact time as indicated for disinfecting solutions.    No follow-ups on file.  Requested Prescriptions     No prescriptions requested or ordered in this encounter

## 2020-01-13 ENCOUNTER — Encounter: Payer: Self-pay | Admitting: Family

## 2020-01-20 ENCOUNTER — Encounter: Payer: Self-pay | Admitting: Family

## 2020-01-20 NOTE — Telephone Encounter (Signed)
Check [place] registry last filled 12/23/2019.Marland KitchenRaechel Chute

## 2020-01-21 ENCOUNTER — Other Ambulatory Visit: Payer: Self-pay | Admitting: Family

## 2020-01-21 NOTE — Telephone Encounter (Signed)
Check [place] registry last filled 12/23/2019../lmb  

## 2020-01-22 ENCOUNTER — Other Ambulatory Visit: Payer: Self-pay | Admitting: Family

## 2020-01-22 DIAGNOSIS — Z79899 Other long term (current) drug therapy: Secondary | ICD-10-CM

## 2020-01-22 MED ORDER — LISDEXAMFETAMINE DIMESYLATE 40 MG PO CAPS: 40.0000 mg | ORAL_CAPSULE | Freq: Every day | ORAL | 0 refills | Status: DC

## 2020-02-18 ENCOUNTER — Other Ambulatory Visit: Payer: Self-pay | Admitting: Family

## 2020-02-19 MED ORDER — LISDEXAMFETAMINE DIMESYLATE 40 MG PO CAPS: 40.0000 mg | ORAL_CAPSULE | Freq: Every day | ORAL | 0 refills | Status: AC

## 2020-03-01 ENCOUNTER — Other Ambulatory Visit: Payer: Self-pay

## 2020-03-01 ENCOUNTER — Encounter (HOSPITAL_COMMUNITY): Payer: Self-pay | Admitting: Emergency Medicine

## 2020-03-01 ENCOUNTER — Emergency Department (HOSPITAL_COMMUNITY)
Admission: EM | Admit: 2020-03-01 | Discharge: 2020-03-01 | Discharge status: Against Medical Advice | Payer: 59 | Attending: Emergency Medicine | Admitting: Emergency Medicine

## 2020-03-01 DIAGNOSIS — R5383 Other fatigue: Secondary | ICD-10-CM | POA: Diagnosis not present

## 2020-03-01 DIAGNOSIS — R200 Anesthesia of skin: Secondary | ICD-10-CM | POA: Insufficient documentation

## 2020-03-01 DIAGNOSIS — R000 Tachycardia, unspecified: Secondary | ICD-10-CM | POA: Diagnosis not present

## 2020-03-01 DIAGNOSIS — R451 Restlessness and agitation: Secondary | ICD-10-CM | POA: Insufficient documentation

## 2020-03-01 DIAGNOSIS — F1510 Other stimulant abuse, uncomplicated: Secondary | ICD-10-CM | POA: Insufficient documentation

## 2020-03-01 DIAGNOSIS — R002 Palpitations: Secondary | ICD-10-CM | POA: Insufficient documentation

## 2020-03-01 DIAGNOSIS — F1910 Other psychoactive substance abuse, uncomplicated: Secondary | ICD-10-CM

## 2020-03-01 LAB — RAPID URINE DRUG SCREEN, HOSP PERFORMED
Amphetamines: POSITIVE — AB
Barbiturates: NOT DETECTED
Benzodiazepines: NOT DETECTED
Cocaine: NOT DETECTED
Opiates: NOT DETECTED
Tetrahydrocannabinol: NOT DETECTED

## 2020-03-01 MED ORDER — LORAZEPAM 2 MG/ML IJ SOLN
1.0000 mg | Freq: Once | INTRAMUSCULAR | Status: AC
  Administered 2020-03-01: 1 mg via INTRAVENOUS
  Filled 2020-03-01: qty 1

## 2020-03-01 MED ORDER — SODIUM CHLORIDE 0.9 % IV BOLUS
1000.0000 mL | Freq: Once | INTRAVENOUS | Status: AC
  Administered 2020-03-01: 1000 mL via INTRAVENOUS

## 2020-03-01 NOTE — ED Provider Notes (Signed)
[facility]-EMERGENCY DEPT Provider Note   CSN: [id number] Arrival date & time: 03/01/20  1430     History Chief Complaint  Patient presents with  . Addiction Problem    Justin Burch is a 34 y.o. male.  Patient is a 34 year old male who presents with feeling of a racing heart and anxiety after using methamphetamines.  He states that he usually only does meth about twice a year.  He dissolves some in some saline and put in a syringe and injected into his rectum.  This occurred about 1 PM today.  He said since that time he has been feeling very anxious and having panic attacks.  He feels like his heart is racing.  He feels like he has no control over his body.  He feels a little short of breath.  No chest pain.  No severe headaches.  No vomiting.  No recent illnesses.  He denies any alcohol or drug use other than the amphetamines.        Past Medical History:  Diagnosis Date  . Alopecia   . Anal skin tag   . GERD (gastroesophageal reflux disease)   . Hemorrhoids   . History of transient ischemic attack (TIA) 2013   no residual per pt--  . Wears glasses     Patient Active Problem List   Diagnosis Date Noted  . Right wrist tendinitis 12/01/2019  . Closed nondisplaced fracture of head of right radius 09/10/2018  . Hair loss 11/16/2016  . Residual hemorrhoidal skin tags 11/16/2016  . Gastroesophageal reflux disease without esophagitis 11/16/2016    Past Surgical History:  Procedure Laterality Date  . EXCISION OF SKIN TAG N/A 01/18/2017   Procedure: EXCISION ANAL SKIN TAG, RECTAL EXAM UNDER ANESTHESIA;  Surgeon: Berna Bue, MD;  Location: [facility];  Service: General;  Laterality: N/A;  . HEMORROIDECTOMY    . NO PAST SURGERIES         Family History  Problem Relation Age of Onset  . Hypertension Mother   . Stroke Mother   . Ovarian cancer Mother   . Kidney disease Mother   . Healthy Father   . Lung cancer Maternal  Grandmother   . Healthy Paternal Grandmother   . Colon cancer Paternal Grandfather     Social History   Tobacco Use  . Smoking status: Never Smoker  . Smokeless tobacco: Never Used  Substance Use Topics  . Alcohol use: Yes    Comment: occasional  . Drug use: No    Home Medications Prior to Admission medications   Medication Sig Start Date End Date Taking? Authorizing Provider  cetirizine (ZYRTEC) 10 MG tablet Take 10 mg by mouth daily as needed for allergies.   Yes [provider]  emtricitabine-tenofovir (TRUVADA) 200-300 MG tablet Take 1 tablet by mouth daily.   Yes [provider]  lisdexamfetamine (VYVANSE) 40 MG capsule Take 1 capsule (40 mg total) by mouth daily. 02/19/20  Yes Olive Bass, FNP    Allergies    Patient has no known allergies.  Review of Systems   Review of Systems  Constitutional: Positive for fatigue. Negative for chills, diaphoresis and fever.  HENT: Negative for congestion, rhinorrhea and sneezing.   Eyes: Negative.   Respiratory: Negative for cough, chest tightness and shortness of breath.   Cardiovascular: Positive for palpitations. Negative for chest pain and leg swelling.  Gastrointestinal: Negative for abdominal pain, blood in stool, diarrhea, nausea and vomiting.  Genitourinary: Negative  for difficulty urinating, flank pain, frequency and hematuria.  Musculoskeletal: Negative for arthralgias and back pain.  Skin: Negative for rash.  Neurological: Positive for numbness (Tingling all over). Negative for dizziness, speech difficulty, weakness and headaches.  Psychiatric/Behavioral: Positive for agitation. The patient is nervous/anxious.     Physical Exam Updated Vital Signs BP (!) 144/103 (BP Location: Right Arm)   Pulse (!) 136   Temp 98.8 F (37.1 C) (Oral)   Resp 20   SpO2 96%   Physical Exam Constitutional:      Appearance: He is well-developed.     Comments: Anxious appearing  HENT:     Head:  Normocephalic and atraumatic.  Eyes:     Pupils: Pupils are equal, round, and reactive to light.  Cardiovascular:     Rate and Rhythm: Regular rhythm. Tachycardia present.     Heart sounds: Normal heart sounds.  Pulmonary:     Effort: Pulmonary effort is normal. No respiratory distress.     Breath sounds: Normal breath sounds. No wheezing or rales.  Chest:     Chest wall: No tenderness.  Abdominal:     General: Bowel sounds are normal.     Palpations: Abdomen is soft.     Tenderness: There is no abdominal tenderness. There is no guarding or rebound.  Musculoskeletal:        General: Normal range of motion.     Cervical back: Normal range of motion and neck supple.  Lymphadenopathy:     Cervical: No cervical adenopathy.  Skin:    General: Skin is warm and dry.     Findings: No rash.  Neurological:     Mental Status: He is alert and oriented to person, place, and time.     ED Results / Procedures / Treatments   Labs (all labs ordered are listed, but only abnormal results are displayed) Labs Reviewed  RAPID URINE DRUG SCREEN, HOSP PERFORMED - Abnormal; Notable for the following components:      Result Value   Amphetamines POSITIVE (*)    All other components within normal limits    EKG EKG Interpretation  Date/Time:  Tuesday March 01 2020 14:42:34 EDT Ventricular Rate:  139 PR Interval:    QRS Duration: 93 QT Interval:  298 QTC Calculation: 454 R Axis:   110 Text Interpretation: Sinus tachycardia Right axis deviation Minimal ST depression, diffuse leads Baseline wander in lead(s) V4 Partial missing lead(s): V3 12 Lead; Mason-Likar SINCE LAST TRACING HEART RATE HAS INCREASED Confirmed by Rolan Bucco [phone]) on 03/01/2020 4:14:40 PM   Radiology No results found.  Procedures Procedures (including critical care time)  Medications Ordered in ED Medications  LORazepam (ATIVAN) injection 1 mg (1 mg Intravenous Given 03/01/20 1647)  sodium chloride 0.9 % bolus 1,000  mL (0 mLs Intravenous Stopped 03/01/20 1913)  LORazepam (ATIVAN) injection 1 mg (1 mg Intravenous Given 03/01/20 1809)    ED Course  I have reviewed the triage vital signs and the nursing notes.  Pertinent labs & imaging results that were available during my care of the patient were reviewed by me and considered in my medical decision making (see chart for details).    MDM Rules/Calculators/A&P                          Patient is a 34 year old male who presents after intoxication with rectal methamphetamines. He is feeling better after Ativan. He no longer has a tingling in his extremities. His  urine drug screen was positive for methamphetamines but negative for other substances. He is calmer although he still has some anxiety and his heart rate is still elevated. I advised him that I would like to monitor him here until his heart rate improves. He was monitored for several hours but then got antsy and ended up leaving AMA. He refused any further monitoring or treatment. He is alert and oriented x4 and denying any thoughts suicide or self harm. Final Clinical Impression(s) / ED Diagnoses Final diagnoses:  Substance abuse [facility])    Rx / DC Orders ED Discharge Orders    None       Rolan Bucco, MD 03/01/20 1954

## 2020-03-01 NOTE — ED Triage Notes (Signed)
Meth use rectally Dissolved it in saline and put it up his butt Now he is feeling short of breath and confused States he does not use drugs all of the time just every couple months

## 2020-03-01 NOTE — ED Notes (Signed)
Pt removed IV and came out in the hallway speaking somewhat incomprehensible. Pt reports wanting to leave. Pt reports calling his ride to come and get him. MD made aware.

## 2020-03-07 ENCOUNTER — Ambulatory Visit: Payer: 59 | Admitting: Family

## 2020-03-07 DIAGNOSIS — Z0289 Encounter for other administrative examinations: Secondary | ICD-10-CM

## 2020-03-10 ENCOUNTER — Ambulatory Visit: Payer: 59 | Admitting: Psychology

## 2020-05-12 ENCOUNTER — Ambulatory Visit: Payer: 59 | Admitting: Psychology

## 2020-05-27 ENCOUNTER — Other Ambulatory Visit: Payer: Self-pay

## 2020-05-27 ENCOUNTER — Emergency Department (HOSPITAL_COMMUNITY)
Admission: EM | Admit: 2020-05-27 | Discharge: 2020-05-28 | Disposition: A | Discharge status: Home / Self Care | Payer: 59 | Attending: Emergency Medicine | Admitting: Emergency Medicine

## 2020-05-27 ENCOUNTER — Encounter (HOSPITAL_COMMUNITY): Payer: Self-pay | Admitting: *Deleted

## 2020-05-27 DIAGNOSIS — R000 Tachycardia, unspecified: Secondary | ICD-10-CM | POA: Insufficient documentation

## 2020-05-27 DIAGNOSIS — K047 Periapical abscess without sinus: Secondary | ICD-10-CM | POA: Diagnosis not present

## 2020-05-27 NOTE — ED Triage Notes (Signed)
The pt has had a dental abscess for 2-3 days  He was at work and  Felt so bad that he went home  And according to his watch he had a heart rate of 160  Then slowed to 130 when he arrived here

## 2020-05-28 LAB — COMPREHENSIVE METABOLIC PANEL
ALT: 15 U/L (ref 0–44)
AST: 24 U/L (ref 15–41)
Albumin: 4.6 g/dL (ref 3.5–5.0)
Alkaline Phosphatase: 70 U/L (ref 38–126)
Anion gap: 12 (ref 5–15)
BUN: 9 mg/dL (ref 6–20)
CO2: 24 mmol/L (ref 22–32)
Calcium: 9 mg/dL (ref 8.9–10.3)
Chloride: 100 mmol/L (ref 98–111)
Creatinine, Ser: 1.33 mg/dL — ABNORMAL HIGH (ref 0.61–1.24)
GFR, Estimated: >60 mL/min (ref >60)
Glucose, Bld: 130 mg/dL — ABNORMAL HIGH (ref 70–99)
Potassium: 3.5 mmol/L (ref 3.5–5.1)
Sodium: 136 mmol/L (ref 135–145)
Total Bilirubin: 1 mg/dL (ref 0.3–1.2)
Total Protein: 7.5 g/dL (ref 6.5–8.1)

## 2020-05-28 LAB — URINALYSIS, ROUTINE W REFLEX MICROSCOPIC
Bacteria, UA: NONE SEEN
Bilirubin Urine: NEGATIVE
Glucose, UA: NEGATIVE mg/dL
Hgb urine dipstick: NEGATIVE
Ketones, ur: NEGATIVE mg/dL
Leukocytes,Ua: NEGATIVE
Nitrite: NEGATIVE
Protein, ur: 100 mg/dL — AB
Specific Gravity, Urine: 1.014 (ref 1.005–1.030)
pH: 5 (ref 5.0–8.0)

## 2020-05-28 LAB — CBC
HCT: 48.3 % (ref 39.0–52.0)
Hemoglobin: 16.7 g/dL (ref 13.0–17.0)
MCH: 29.9 pg (ref 26.0–34.0)
MCHC: 34.6 g/dL (ref 30.0–36.0)
MCV: 86.6 fL (ref 80.0–100.0)
Platelets: 299 10*3/uL (ref 150–400)
RBC: 5.58 MIL/uL (ref 4.22–5.81)
RDW: 12.6 % (ref 11.5–15.5)
WBC: 14.8 10*3/uL — ABNORMAL HIGH (ref 4.0–10.5)
nRBC: 0 % (ref 0.0–0.2)

## 2020-05-28 LAB — LIPASE, BLOOD: Lipase: 31 U/L (ref 11–51)

## 2020-05-28 MED ORDER — CLINDAMYCIN HCL 150 MG PO CAPS
300.0000 mg | ORAL_CAPSULE | Freq: Once | ORAL | Status: AC
  Administered 2020-05-28: 300 mg via ORAL
  Filled 2020-05-28: qty 2

## 2020-05-28 MED ORDER — CLINDAMYCIN HCL 300 MG PO CAPS
300.0000 mg | ORAL_CAPSULE | Freq: Four times a day (QID) | ORAL | 0 refills | Status: DC
Start: 2020-05-28 — End: 2023-12-13

## 2020-05-28 NOTE — ED Provider Notes (Signed)
[name] EMERGENCY DEPARTMENT Provider Note   CSN: [id number] Arrival date & time: 05/27/20  2324     History Chief Complaint  Patient presents with  . Tachycardia    Justin Burch is a 34 y.o. male.  Patient has had an abscess in his mouth last couple days and progressively worsening.  Today started feel a bit nauseous and not like himself so he thought he might be getting septic.  He checked his heart rate and it was 160s who presented here for further evaluation.  He states after being in the waiting room for a few hours his heart rate improved.  He states that the abscess drained while he was here as well and at this time he is asymptomatic. H/o HIV, compliant with meds. No other symptoms. Will see dentist Monday.   Illness Location:  All over Quality:  Feels bad      Past Medical History:  Diagnosis Date  . Alopecia   . Anal skin tag   . GERD (gastroesophageal reflux disease)   . Hemorrhoids   . History of transient ischemic attack (TIA) 2013   no residual per pt--  . Wears glasses     Patient Active Problem List   Diagnosis Date Noted  . Right wrist tendinitis 12/01/2019  . Closed nondisplaced fracture of head of right radius 09/10/2018  . Hair loss 11/16/2016  . Residual hemorrhoidal skin tags 11/16/2016  . Gastroesophageal reflux disease without esophagitis 11/16/2016    Past Surgical History:  Procedure Laterality Date  . EXCISION OF SKIN TAG N/A 01/18/2017   Procedure: EXCISION ANAL SKIN TAG, RECTAL EXAM UNDER ANESTHESIA;  Surgeon: Berna Bue, MD;  Location: [facility] [place];  Service: General;  Laterality: N/A;  . HEMORROIDECTOMY    . NO PAST SURGERIES         Family History  Problem Relation Age of Onset  . Hypertension Mother   . Stroke Mother   . Ovarian cancer Mother   . Kidney disease Mother   . Healthy Father   . Lung cancer Maternal Grandmother   . Healthy Paternal Grandmother   . Colon cancer  Paternal Grandfather     Social History   Tobacco Use  . Smoking status: Never Smoker  . Smokeless tobacco: Never Used  Substance Use Topics  . Alcohol use: Yes    Comment: occasional  . Drug use: No    Home Medications Prior to Admission medications   Medication Sig Start Date End Date Taking? Authorizing Provider  emtricitabine-tenofovir (TRUVADA) 200-300 MG tablet Take 1 tablet by mouth daily.   Yes [provider]  lisdexamfetamine (VYVANSE) 40 MG capsule Take 1 capsule (40 mg total) by mouth daily. 02/19/20  Yes Olive Bass, FNP  cetirizine (ZYRTEC) 10 MG tablet Take 10 mg by mouth daily as needed for allergies.    [provider]  clindamycin (CLEOCIN) 300 MG capsule Take 1 capsule (300 mg total) by mouth 4 (four) times daily. X 7 days 05/28/20   Celeste Tavenner, Barbara Cower, MD    Allergies    Patient has no known allergies.  Review of Systems   Review of Systems  All other systems reviewed and are negative.   Physical Exam Updated Vital Signs BP (!) 145/106 (BP Location: Right Arm)   Pulse 95   Temp 99.2 F (37.3 C) (Oral)   Resp 18   Ht 5\' 9"  (1.753 m)   Wt 88.5 kg   SpO2 99%  BMI 28.80 kg/m   Physical Exam Vitals and nursing note reviewed.  Constitutional:      Appearance: He is well-developed.  HENT:     Head: Normocephalic and atraumatic.     Comments: Overall patient appears well.  He does have evidence of significant dental caries and infection to his right upper gingiva.  As above a broken tooth with cavities.    Nose: Nose normal. No congestion or rhinorrhea.     Mouth/Throat:     Mouth: Mucous membranes are moist.     Pharynx: Oropharynx is clear.  Eyes:     Pupils: Pupils are equal, round, and reactive to light.  Cardiovascular:     Rate and Rhythm: Normal rate.  Pulmonary:     Effort: Pulmonary effort is normal. No respiratory distress.  Abdominal:     General: Abdomen is flat. There is no distension.  Musculoskeletal:         General: Normal range of motion.     Cervical back: Normal range of motion.  Skin:    General: Skin is warm and dry.     Coloration: Skin is not jaundiced or pale.  Neurological:     General: No focal deficit present.     Mental Status: He is alert.     ED Results / Procedures / Treatments   Labs (all labs ordered are listed, but only abnormal results are displayed) Labs Reviewed  COMPREHENSIVE METABOLIC PANEL - Abnormal; Notable for the following components:      Result Value   Glucose, Bld 130 (*)    Creatinine, Ser 1.33 (*)    All other components within normal limits  CBC - Abnormal; Notable for the following components:   WBC 14.8 (*)    All other components within normal limits  URINALYSIS, ROUTINE W REFLEX MICROSCOPIC - Abnormal; Notable for the following components:   Protein, ur 100 (*)    All other components within normal limits  LIPASE, BLOOD    EKG EKG Interpretation  Date/Time:  Friday May 27 2020 23:30:55 EDT Ventricular Rate:  127 PR Interval:  156 QRS Duration: 88 QT Interval:  304 QTC Calculation: 441 R Axis:   97 Text Interpretation: Sinus tachycardia Rightward axis ST & T wave abnormality, consider inferior ischemia Abnormal ECG No significant change since last tracing Confirmed by Marily Memos [phone]) on 05/28/2020 3:35:36 AM   Radiology No results found.  Procedures Procedures (including critical care time)  Medications Ordered in ED Medications  clindamycin (CLEOCIN) capsule 300 mg (300 mg Oral Given 05/28/20 0531)    ED Course  I have reviewed the triage vital signs and the nursing notes.  Pertinent labs & imaging results that were available during my care of the patient were reviewed by me and considered in my medical decision making (see chart for details).    MDM Rules/Calculators/A&P                          No evidence of sepsis.  Patient appears well.  Vital signs within normal limits at this time.  Abscess is  already drained no indication for incision at this time.  Will start antibiotics and he is already following up with a dentist on Monday.  Final Clinical Impression(s) / ED Diagnoses Final diagnoses:  Tachycardia  Dental abscess    Rx / DC Orders ED Discharge Orders         Ordered    clindamycin (CLEOCIN) 300  MG capsule  4 times daily        05/28/20 0525           Willaim Mode, Barbara Cower, MD 05/28/20 2979

## 2021-02-22 ENCOUNTER — Other Ambulatory Visit: Payer: Self-pay

## 2021-02-22 ENCOUNTER — Encounter (HOSPITAL_COMMUNITY): Payer: Self-pay

## 2021-02-22 ENCOUNTER — Emergency Department (HOSPITAL_COMMUNITY)
Admission: EM | Admit: 2021-02-22 | Discharge: 2021-02-22 | Disposition: A | Discharge status: Home / Self Care | Payer: 59 | Attending: Emergency Medicine | Admitting: Emergency Medicine

## 2021-02-22 DIAGNOSIS — M542 Cervicalgia: Secondary | ICD-10-CM | POA: Insufficient documentation

## 2021-02-22 DIAGNOSIS — Z5321 Procedure and treatment not carried out due to patient leaving prior to being seen by health care provider: Secondary | ICD-10-CM | POA: Insufficient documentation

## 2021-02-22 DIAGNOSIS — F419 Anxiety disorder, unspecified: Secondary | ICD-10-CM | POA: Insufficient documentation

## 2021-02-22 NOTE — ED Notes (Signed)
Pt turned in stickers and left facility.

## 2021-02-22 NOTE — ED Triage Notes (Signed)
Pt BIB EMS  Per EMS, pt took meth earlier and was having an anxiety attack. EMS assessed him and he stayed at home. Pt now complains of neck pain.   Hr 114 170/100 BP 100 % room air

## 2021-04-15 ENCOUNTER — Emergency Department (HOSPITAL_COMMUNITY): Payer: Self-pay

## 2021-04-15 ENCOUNTER — Emergency Department (HOSPITAL_COMMUNITY)
Admission: EM | Admit: 2021-04-15 | Discharge: 2021-04-15 | Disposition: A | Discharge status: Home / Self Care | Payer: Self-pay | Attending: Emergency Medicine | Admitting: Emergency Medicine

## 2021-04-15 ENCOUNTER — Encounter (HOSPITAL_COMMUNITY): Payer: Self-pay | Admitting: Emergency Medicine

## 2021-04-15 DIAGNOSIS — S42021A Displaced fracture of shaft of right clavicle, initial encounter for closed fracture: Secondary | ICD-10-CM | POA: Insufficient documentation

## 2021-04-15 DIAGNOSIS — Y92312 Tennis court as the place of occurrence of the external cause: Secondary | ICD-10-CM | POA: Insufficient documentation

## 2021-04-15 DIAGNOSIS — W010XXA Fall on same level from slipping, tripping and stumbling without subsequent striking against object, initial encounter: Secondary | ICD-10-CM | POA: Insufficient documentation

## 2021-04-15 MED ORDER — IBUPROFEN 400 MG PO TABS
600.0000 mg | ORAL_TABLET | Freq: Once | ORAL | Status: AC
  Administered 2021-04-15: 600 mg via ORAL
  Filled 2021-04-15: qty 1

## 2021-04-15 NOTE — ED Notes (Signed)
Patient returns from xray.

## 2021-04-15 NOTE — ED Notes (Signed)
Patient given discharge instructions, all questions answered. Patient in possession of all belongings, directed to the discharge area  

## 2021-04-15 NOTE — ED Provider Notes (Signed)
[facility] EMERGENCY DEPARTMENT Provider Note   CSN: [id number] Arrival date & time: 04/15/21  1009     History No chief complaint on file.   Justin Burch is a 35 y.o. male presenting with a complaint of clavicular pain after a trip and fall on the tennis court earlier this morning.  Patient reports that he was reaching for a ball when he tripped and fell onto his right shoulder.  Reports feeling his clavicle break and instant pain that he describes as sharp.  Rates the pain 5/10 when he is sitting still however when he is attempting to move his endorses 10/10 pain. He has not tried any medication and came straight here.  Denies any LOC.  Endorses abrasions and pain to his right elbow as well.  Was able to move the limb after the injury on the tennis court.  Denies numbness or tingling.  Past Medical History:  Diagnosis Date   Alopecia    Anal skin tag    GERD (gastroesophageal reflux disease)    Hemorrhoids    History of transient ischemic attack (TIA) 2013   no residual per pt--   Wears glasses     Patient Active Problem List   Diagnosis Date Noted   Right wrist tendinitis 12/01/2019   Closed nondisplaced fracture of head of right radius 09/10/2018   Hair loss 11/16/2016   Residual hemorrhoidal skin tags 11/16/2016   Gastroesophageal reflux disease without esophagitis 11/16/2016    Past Surgical History:  Procedure Laterality Date   EXCISION OF SKIN TAG N/A 01/18/2017   Procedure: EXCISION ANAL SKIN TAG, RECTAL EXAM UNDER ANESTHESIA;  Surgeon: Berna Bue, MD;  Location: [facility];  Service: General;  Laterality: N/A;   HEMORROIDECTOMY     NO PAST SURGERIES         Family History  Problem Relation Age of Onset   Hypertension Mother    Stroke Mother    Ovarian cancer Mother    Kidney disease Mother    Healthy Father    Lung cancer Maternal Grandmother    Healthy Paternal Grandmother    Colon cancer Paternal Grandfather      Social History   Tobacco Use   Smoking status: Never   Smokeless tobacco: Never  Substance Use Topics   Alcohol use: Yes    Comment: occasional   Drug use: No    Home Medications Prior to Admission medications   Medication Sig Start Date End Date Taking? Authorizing Provider  cetirizine (ZYRTEC) 10 MG tablet Take 10 mg by mouth daily as needed for allergies.    [provider]  clindamycin (CLEOCIN) 300 MG capsule Take 1 capsule (300 mg total) by mouth 4 (four) times daily. X 7 days 05/28/20   Mesner, Barbara Cower, MD  emtricitabine-tenofovir (TRUVADA) 200-300 MG tablet Take 1 tablet by mouth daily.    [provider]  lisdexamfetamine (VYVANSE) 40 MG capsule Take 1 capsule (40 mg total) by mouth daily. 02/19/20   Olive Bass, FNP    Allergies    Patient has no known allergies.  Review of Systems   Review of Systems  Respiratory:  Negative for shortness of breath.   Musculoskeletal:  Positive for joint swelling. Negative for back pain.  Skin:  Positive for wound.  Neurological:  Positive for weakness. Negative for dizziness, syncope, numbness and headaches.  Psychiatric/Behavioral:  Negative for confusion.   All other systems reviewed and are negative.  Physical Exam Updated Vital  Signs BP 129/83 (BP Location: Left Arm)   Pulse 71   Temp 98.3 F (36.8 C) (Oral)   Resp 16   SpO2 97%   Physical Exam Vitals and nursing note reviewed.  Constitutional:      Appearance: Normal appearance.  HENT:     Head: Normocephalic and atraumatic.  Eyes:     General: No scleral icterus.    Conjunctiva/sclera: Conjunctivae normal.  Pulmonary:     Effort: Pulmonary effort is normal. No respiratory distress.  Musculoskeletal:        General: Tenderness, deformity and signs of injury present.     Cervical back: Normal range of motion.     Comments: Patient with right-sided mid clavicular step-off.  Tenderness to the area.  Injury does not reveal large  amounts of inflammation.  Tenderness to the humeral head as well.  Right elbow with abrasion and tenderness.  Patient reports that when he tries to make a fist he feels pain in his right shoulder.  Patient also with mild abrasion to lateral right lower leg.  Skin:    General: Skin is warm and dry.     Capillary Refill: Capillary refill takes less than 2 seconds.     Findings: No bruising or rash.  Neurological:     Mental Status: He is alert.     Sensory: No sensory deficit.  Psychiatric:        Mood and Affect: Mood normal.        Behavior: Behavior normal.    ED Results / Procedures / Treatments   Labs (all labs ordered are listed, but only abnormal results are displayed) Labs Reviewed - No data to display  EKG None  Radiology DG Clavicle Right  Result Date: 04/15/2021 CLINICAL DATA:  Fall, pain EXAM: RIGHT CLAVICLE - 2+ VIEWS COMPARISON:  None. FINDINGS: There is a displaced, comminuted oblique fracture of the middle third of the right clavicle, with elevation of the proximal portion of the clavicle. The acromioclavicular joint and coracoclavicular interval appear preserved. IMPRESSION: Displaced, comminuted oblique fracture of the middle third of the right clavicle, with elevation of the proximal portion of the clavicle. Electronically Signed   By: Lauralyn Primes M.D.   On: 04/15/2021 11:32   DG Humerus Right  Result Date: 04/15/2021 CLINICAL DATA:  Fall, pain EXAM: RIGHT HUMERUS - 2+ VIEW COMPARISON:  None. FINDINGS: There is no evidence of fracture or other focal bone lesions. Soft tissues are unremarkable. IMPRESSION: No fracture or dislocation of the right humerus. Electronically Signed   By: Lauralyn Primes M.D.   On: 04/15/2021 11:31    Procedures Procedures   Medications Ordered in ED Medications  ibuprofen (ADVIL) tablet 600 mg (600 mg Oral Given 04/15/21 1127)    ED Course  I have reviewed the triage vital signs and the nursing notes.  Pertinent labs & imaging results  that were available during my care of the patient were reviewed by me and considered in my medical decision making (see chart for details).    MDM Rules/Calculators/A&P Justin Burch is a 35 y.o. male presenting with a complaint of clavicular pain after a trip and fall on the tennis court earlier this morning.  Patient reports that he was reaching for a ball when he tripped and fell onto his right shoulder.  Reports feeling his clavicle break and instant pain that he describes as sharp.  Rates the pain 5/10 when he is sitting still however when he is attempting to move his  endorses 10/10 pain. He has not tried any medication and came straight here.  Denies any LOC.  Endorses abrasions and pain to his right elbow as well.  Was able to move the limb after the injury on the tennis court.  Denies numbness or tingling.  I obtained radiographs which revealed a midshaft right clavicular fracture.  Displaced, oblique and comminuted.  I spoke with orthopedic provider Dr. Susa Simmonds who recommended a shoulder immobilizer and sling until he can follow-up with early next week. I discussed with the patient and he has understanding of the plan, however he reports that he does not have health insurance at this moment.  I told him that I am unsure how this will affect him however he should mention this during the phone call patient with the office. He will utilize anti-inflammatories alleviated and ibuprofen as well as ice the area and symptomatic care.  [employer] has placed shoulder immobilizer.  I rechecked this and the patient remained NV intact and stable for discharge.  Final Clinical Impression(s) / ED Diagnoses Final diagnoses:  Closed displaced fracture of shaft of right clavicle, initial encounter    Rx / DC Orders Results and diagnoses were explained to the patient. Return precautions discussed in full. Patient had no additional questions and expressed complete understanding.     Woodroe Chen 04/15/21 1432    Linwood Dibbles, MD 04/16/21 1004

## 2021-04-15 NOTE — Progress Notes (Signed)
Orthopedic Tech Progress Note Patient Details:  Aleph Nickson 31-Oct-1985 [id number]  Ortho Devices Type of Ortho Device: Shoulder immobilizer Ortho Device/Splint Location: RUE Ortho Device/Splint Interventions: Ordered, Application   Post Interventions Patient Tolerated: Well Instructions Provided: Poper ambulation with device  Nivek Powley A Aidynn Polendo 04/15/2021, 3:33 PM

## 2021-04-15 NOTE — ED Triage Notes (Signed)
Pt states he fell playing tennis and landed on R shoulder just PTA.  Abrasion to R elbow.  C/o R clavicle pain.  CMS intact.

## 2021-04-15 NOTE — ED Notes (Signed)
Patient states pain is better with immobilization. Patient made aware we are waiting on brace to be obtained, denies any other needs at this time.

## 2022-12-02 ENCOUNTER — Emergency Department (HOSPITAL_COMMUNITY)
Admission: EM | Admit: 2022-12-02 | Discharge: 2022-12-02 | Disposition: A | Discharge status: Home / Self Care | Payer: Self-pay | Attending: Emergency Medicine | Admitting: Emergency Medicine

## 2022-12-02 ENCOUNTER — Encounter (HOSPITAL_COMMUNITY): Payer: Self-pay

## 2022-12-02 ENCOUNTER — Other Ambulatory Visit: Payer: Self-pay

## 2022-12-02 DIAGNOSIS — N4830 Priapism, unspecified: Secondary | ICD-10-CM | POA: Insufficient documentation

## 2022-12-02 LAB — BASIC METABOLIC PANEL
Anion gap: 10 (ref 5–15)
BUN: 11 mg/dL (ref 6–20)
CO2: 25 mmol/L (ref 22–32)
Calcium: 9.2 mg/dL (ref 8.9–10.3)
Chloride: 104 mmol/L (ref 98–111)
Creatinine, Ser: 1.13 mg/dL (ref 0.61–1.24)
GFR, Estimated: >60 mL/min (ref >60)
Glucose, Bld: 107 mg/dL — ABNORMAL HIGH (ref 70–99)
Potassium: 3.8 mmol/L (ref 3.5–5.1)
Sodium: 139 mmol/L (ref 135–145)

## 2022-12-02 LAB — CBC WITH DIFFERENTIAL/PLATELET
Abs Immature Granulocytes: 0.03 10*3/uL (ref 0.00–0.07)
Basophils Absolute: 0.1 10*3/uL (ref 0.0–0.1)
Basophils Relative: 1 %
Eosinophils Absolute: 0 10*3/uL (ref 0.0–0.5)
Eosinophils Relative: 1 %
HCT: 45.5 % (ref 39.0–52.0)
Hemoglobin: 15.7 g/dL (ref 13.0–17.0)
Immature Granulocytes: 0 %
Lymphocytes Relative: 25 %
Lymphs Abs: 2 10*3/uL (ref 0.7–4.0)
MCH: 29.7 pg (ref 26.0–34.0)
MCHC: 34.5 g/dL (ref 30.0–36.0)
MCV: 86.2 fL (ref 80.0–100.0)
Monocytes Absolute: 0.8 10*3/uL (ref 0.1–1.0)
Monocytes Relative: 10 %
Neutro Abs: 5.1 10*3/uL (ref 1.7–7.7)
Neutrophils Relative %: 63 %
Platelets: 213 10*3/uL (ref 150–400)
RBC: 5.28 MIL/uL (ref 4.22–5.81)
RDW: 13.2 % (ref 11.5–15.5)
WBC: 8 10*3/uL (ref 4.0–10.5)
nRBC: 0 % (ref 0.0–0.2)

## 2022-12-02 MED ORDER — LORAZEPAM 2 MG/ML IJ SOLN
1.0000 mg | Freq: Once | INTRAMUSCULAR | Status: AC
  Administered 2022-12-02: 1 mg via INTRAVENOUS
  Filled 2022-12-02: qty 1

## 2022-12-02 MED ORDER — SODIUM CHLORIDE 0.9 % IV BOLUS
1000.0000 mL | Freq: Once | INTRAVENOUS | Status: AC
  Administered 2022-12-02: 1000 mL via INTRAVENOUS

## 2022-12-02 NOTE — ED Provider Notes (Signed)
[place] EMERGENCY DEPARTMENT AT [facility] Provider Note   CSN: [id number] Arrival date & time: 12/02/22  9811     History {Add pertinent medical, surgical, social history, OB history to HPI:1} Chief Complaint  Patient presents with   Priapism    Justin Burch is a 37 y.o. male.  Patient states that he was taking some Adderall for fun last night.  He states he has had an erection for 4-1/2 to 5 hours.   Male GU Problem      Home Medications Prior to Admission medications   Medication Sig Start Date End Date Taking? Authorizing Provider  cetirizine (ZYRTEC) 10 MG tablet Take 10 mg by mouth daily as needed for allergies.   Yes [provider]  emtricitabine-tenofovir (TRUVADA) 200-300 MG tablet Take 1 tablet by mouth daily.   Yes [provider]  esomeprazole (NEXIUM) 10 MG packet Take 10 mg by mouth daily before breakfast.   Yes [provider]  clindamycin (CLEOCIN) 300 MG capsule Take 1 capsule (300 mg total) by mouth 4 (four) times daily. X 7 days Patient not taking: Reported on 12/02/2022 05/28/20   Mesner, Barbara Cower, MD  lisdexamfetamine (VYVANSE) 40 MG capsule Take 1 capsule (40 mg total) by mouth daily. Patient not taking: Reported on 12/02/2022 02/19/20   Olive Bass, FNP      Allergies    Patient has no known allergies.    Review of Systems   Review of Systems  Physical Exam Updated Vital Signs BP (!) 171/107   Pulse (!) 120   Temp 98.3 F (36.8 C) (Oral)   Resp 16   Ht 5\' 9"  (1.753 m)   Wt 86.2 kg   SpO2 96%   BMI 28.06 kg/m  Physical Exam  ED Results / Procedures / Treatments   Labs (all labs ordered are listed, but only abnormal results are displayed) Labs Reviewed  BASIC METABOLIC PANEL - Abnormal; Notable for the following components:      Result Value   Glucose, Bld 107 (*)    All other components within normal limits  CBC WITH DIFFERENTIAL/PLATELET    EKG None  Radiology No results  found.  Procedures Procedures  {Document cardiac monitor, telemetry assessment procedure when appropriate:1}  Medications Ordered in ED Medications  sodium chloride 0.9 % bolus 1,000 mL (0 mLs Intravenous Stopped 12/02/22 1112)  LORazepam (ATIVAN) injection 1 mg (1 mg Intravenous Given 12/02/22 1022)    ED Course/ Medical Decision Making/ A&P   {   Click here for ABCD2, HEART and other calculatorsREFRESH Note before signing :1}                          Medical Decision Making Amount and/or Complexity of Data Reviewed Labs: ordered.  Risk Prescription drug management.   patient with priapism.  He improved within an hour after given some fluid and Ativan.  He will stop taking the Adderall and get his blood pressure checked in a couple weeks  {Document critical care time when appropriate:1} {Document review of labs and clinical decision tools ie heart score, Chads2Vasc2 etc:1}  {Document your independent review of radiology images, and any outside records:1} {Document your discussion with family members, caretakers, and with consultants:1} {Document social determinants of health affecting pt's care:1} {Document your decision making why or why not admission, treatments were needed:1} Final Clinical Impression(s) / ED Diagnoses Final diagnoses:  Priapism    Rx / DC Orders ED  Discharge Orders     None

## 2022-12-02 NOTE — ED Triage Notes (Addendum)
Patient has had an erection for 12 hours. Was having intercourse and it never stopped since. Has never happened before. Feels uncomfortable.

## 2022-12-02 NOTE — Discharge Instructions (Signed)
Follow-up in a couple weeks to get your blood pressure checked.  Do not take Adderall anymore unless it is prescribed to you

## 2023-06-12 IMAGING — CR DG HUMERUS 2V *R*
2 series · 2 of 2 positions shown · non-contrast
Comparison: None.

CLINICAL DATA: Fall, pain

EXAM:
RIGHT HUMERUS - 2+ VIEW

[humerus lat]
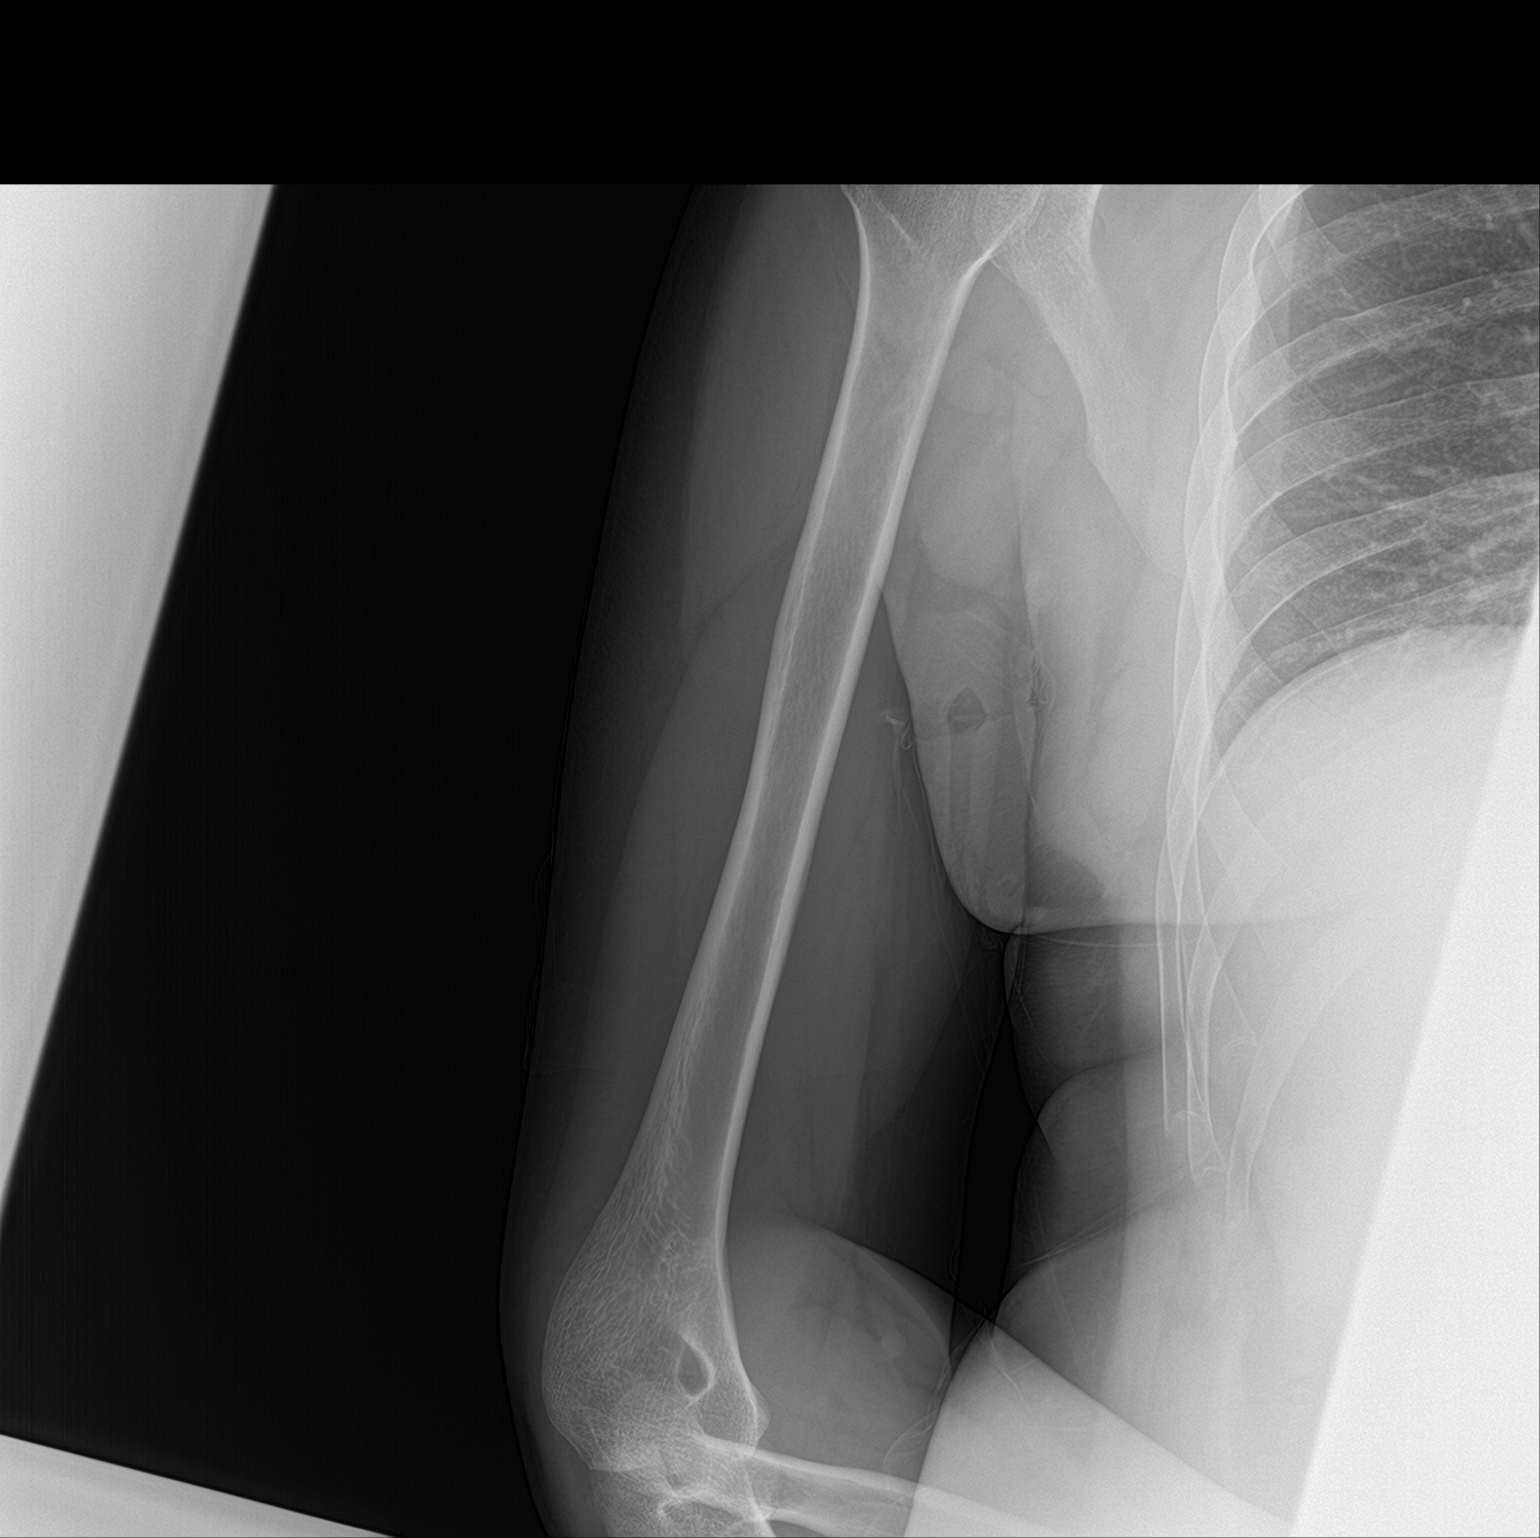

[humerus ap]
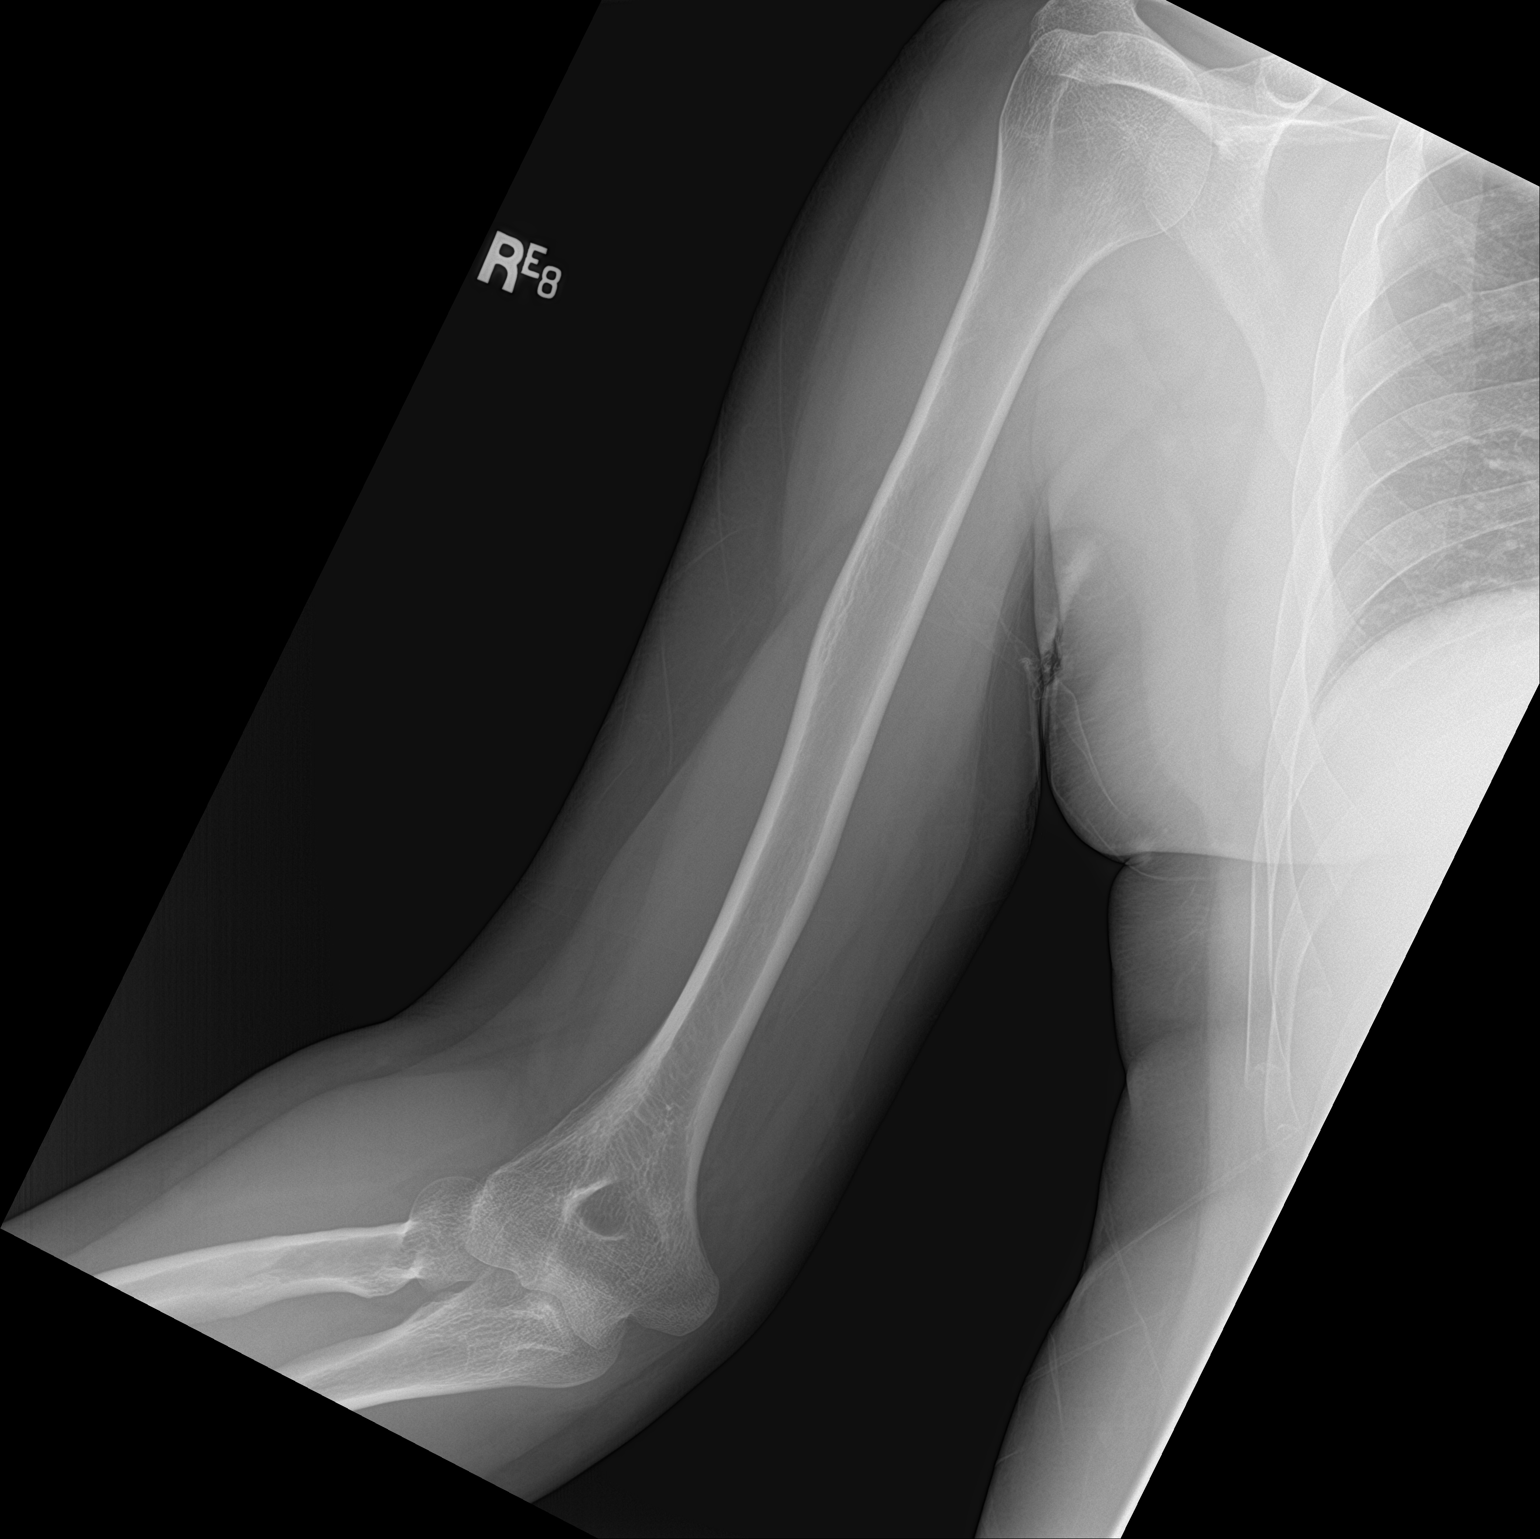

[2 of 2 positions shown; findings below may reference images not displayed]

FINDINGS: There is no evidence of fracture or other focal bone lesions. Soft
tissues are unremarkable.
IMPRESSION: No fracture or dislocation of the right humerus.

## 2023-10-04 DIAGNOSIS — A539 Syphilis, unspecified: Secondary | ICD-10-CM

## 2023-10-04 HISTORY — DX: Syphilis, unspecified: A53.9

## 2023-10-06 ENCOUNTER — Emergency Department (HOSPITAL_COMMUNITY): Payer: Self-pay

## 2023-10-06 ENCOUNTER — Other Ambulatory Visit: Payer: Self-pay

## 2023-10-06 ENCOUNTER — Encounter (HOSPITAL_COMMUNITY): Payer: Self-pay

## 2023-10-06 ENCOUNTER — Emergency Department (HOSPITAL_COMMUNITY)
Admission: EM | Admit: 2023-10-06 | Discharge: 2023-10-06 | Discharge status: Against Medical Advice | Payer: Self-pay | Attending: Emergency Medicine | Admitting: Emergency Medicine

## 2023-10-06 DIAGNOSIS — Z8673 Personal history of transient ischemic attack (TIA), and cerebral infarction without residual deficits: Secondary | ICD-10-CM | POA: Insufficient documentation

## 2023-10-06 DIAGNOSIS — R0789 Other chest pain: Secondary | ICD-10-CM | POA: Insufficient documentation

## 2023-10-06 DIAGNOSIS — Z5321 Procedure and treatment not carried out due to patient leaving prior to being seen by health care provider: Secondary | ICD-10-CM | POA: Insufficient documentation

## 2023-10-06 LAB — CBC WITH DIFFERENTIAL/PLATELET
Abs Immature Granulocytes: 0.03 10*3/uL (ref 0.00–0.07)
Basophils Absolute: 0 10*3/uL (ref 0.0–0.1)
Basophils Relative: 0 %
Eosinophils Absolute: 0.1 10*3/uL (ref 0.0–0.5)
Eosinophils Relative: 1 %
HCT: 44.9 % (ref 39.0–52.0)
Hemoglobin: 15.6 g/dL (ref 13.0–17.0)
Immature Granulocytes: 0 %
Lymphocytes Relative: 15 %
Lymphs Abs: 1.4 10*3/uL (ref 0.7–4.0)
MCH: 30.2 pg (ref 26.0–34.0)
MCHC: 34.7 g/dL (ref 30.0–36.0)
MCV: 86.8 fL (ref 80.0–100.0)
Monocytes Absolute: 0.6 10*3/uL (ref 0.1–1.0)
Monocytes Relative: 7 %
Neutro Abs: 7.2 10*3/uL (ref 1.7–7.7)
Neutrophils Relative %: 77 %
Platelets: 282 10*3/uL (ref 150–400)
RBC: 5.17 MIL/uL (ref 4.22–5.81)
RDW: 12.5 % (ref 11.5–15.5)
WBC: 9.4 10*3/uL (ref 4.0–10.5)
nRBC: 0 % (ref 0.0–0.2)

## 2023-10-06 LAB — COMPREHENSIVE METABOLIC PANEL
ALT: 29 U/L (ref 0–44)
AST: 31 U/L (ref 15–41)
Albumin: 3.7 g/dL (ref 3.5–5.0)
Alkaline Phosphatase: 75 U/L (ref 38–126)
Anion gap: 11 (ref 5–15)
BUN: 11 mg/dL (ref 6–20)
CO2: 23 mmol/L (ref 22–32)
Calcium: 9.4 mg/dL (ref 8.9–10.3)
Chloride: 103 mmol/L (ref 98–111)
Creatinine, Ser: 1.18 mg/dL (ref 0.61–1.24)
GFR, Estimated: >60 mL/min (ref >60)
Glucose, Bld: 96 mg/dL (ref 70–99)
Potassium: 3.9 mmol/L (ref 3.5–5.1)
Sodium: 137 mmol/L (ref 135–145)
Total Bilirubin: 0.8 mg/dL (ref 0.0–1.2)
Total Protein: 7.8 g/dL (ref 6.5–8.1)

## 2023-10-06 LAB — D-DIMER, QUANTITATIVE: D-Dimer, Quant: 0.78 ug{FEU}/mL — ABNORMAL HIGH (ref 0.00–0.50)

## 2023-10-06 LAB — TROPONIN I (HIGH SENSITIVITY): Troponin I (High Sensitivity): 3 ng/L (ref <18)

## 2023-10-06 MED ORDER — IOHEXOL 350 MG/ML SOLN
75.0000 mL | Freq: Once | INTRAVENOUS | Status: AC | PRN
  Administered 2023-10-06: 75 mL via INTRAVENOUS

## 2023-10-06 NOTE — ED Notes (Signed)
 Pt not answering to call for second lab draw

## 2023-10-06 NOTE — ED Triage Notes (Addendum)
 PER EMS: pt is from work when he started having sudden onset of stabbing intermittent chest pain associated with dizziness, weakness and felt like he was going to pass out. He sat down and the pain subsided. No current chest pain/n/v/d. He states he feels mostly back to normal at this time.   BP- 160/110, HR-90, 98% RA, RR-18  He reports he was treated for Syphilis on Friday.

## 2023-10-06 NOTE — ED Provider Notes (Signed)
 Triage note Medical screening  Patient was at work at around 1730 he started to have some left anterior chest pain that was on and off lasting 5 to 10 minutes.  But then at 1840 had a more severe type pain got dizzy almost passed out.  No nausea vomiting or diarrhea has never had anything like this before does not seem to have any feeling of any upper respiratory symptoms sore throat or cough.  Patient was treated for syphilis on Friday it was noted here that patient's temp was 100.3.  Past medical history reports transient ischemic attack in 2013 hemorrhoids syphilis in March 2025.  Past surgical history significant for hemorrhoidectomy patient does not use tobacco products.  Vital signs temp 100.3 pulse 84 respiration 17 blood pressure 160/107 oxygen saturation is 98% on room air Patient heart regular rate and rhythm Lungs clear to auscultation bilaterally abdomen soft and nontender  Patient's events somewhat unusual oxygen saturations are good and he is not tachycardic but because of the nature of it we will check D-dimer we will get troponins x 2 will get CBC complete metabolic panel and place an IV and get chest x-ray.  Patient is currently pain-free   Vanetta Mulders, MD 10/06/23 [phone]

## 2023-10-07 ENCOUNTER — Emergency Department (HOSPITAL_COMMUNITY)
Admission: EM | Admit: 2023-10-07 | Discharge: 2023-10-07 | Discharge status: Against Medical Advice | Payer: Self-pay | Attending: Emergency Medicine | Admitting: Emergency Medicine

## 2023-10-07 ENCOUNTER — Other Ambulatory Visit: Payer: Self-pay

## 2023-10-07 DIAGNOSIS — R079 Chest pain, unspecified: Secondary | ICD-10-CM | POA: Insufficient documentation

## 2023-10-07 DIAGNOSIS — Z5321 Procedure and treatment not carried out due to patient leaving prior to being seen by health care provider: Secondary | ICD-10-CM | POA: Insufficient documentation

## 2023-10-07 DIAGNOSIS — R0602 Shortness of breath: Secondary | ICD-10-CM | POA: Insufficient documentation

## 2023-10-07 NOTE — ED Triage Notes (Signed)
 Pt. Stated, I was here last night and got over looked so I had to check back in. I had chest pain and SOB  when I was at work yesterday around 530 pm. I had all my stuff completed.

## 2023-10-07 NOTE — ED Notes (Signed)
 Pt left without being seen.

## 2023-11-23 ENCOUNTER — Encounter (HOSPITAL_COMMUNITY): Payer: Self-pay | Admitting: Emergency Medicine

## 2023-11-23 ENCOUNTER — Emergency Department (HOSPITAL_COMMUNITY): Payer: Self-pay

## 2023-11-23 ENCOUNTER — Other Ambulatory Visit: Payer: Self-pay

## 2023-11-23 ENCOUNTER — Emergency Department (HOSPITAL_COMMUNITY)
Admission: EM | Admit: 2023-11-23 | Discharge: 2023-11-23 | Disposition: A | Discharge status: Home / Self Care | Payer: Self-pay | Attending: Emergency Medicine | Admitting: Emergency Medicine

## 2023-11-23 DIAGNOSIS — R55 Syncope and collapse: Secondary | ICD-10-CM | POA: Insufficient documentation

## 2023-11-23 DIAGNOSIS — I10 Essential (primary) hypertension: Secondary | ICD-10-CM | POA: Insufficient documentation

## 2023-11-23 DIAGNOSIS — Z8673 Personal history of transient ischemic attack (TIA), and cerebral infarction without residual deficits: Secondary | ICD-10-CM | POA: Insufficient documentation

## 2023-11-23 LAB — CBC
HCT: 53 % — ABNORMAL HIGH (ref 39.0–52.0)
Hemoglobin: 18.2 g/dL — ABNORMAL HIGH (ref 13.0–17.0)
MCH: 29.9 pg (ref 26.0–34.0)
MCHC: 34.3 g/dL (ref 30.0–36.0)
MCV: 87.2 fL (ref 80.0–100.0)
Platelets: 247 10*3/uL (ref 150–400)
RBC: 6.08 MIL/uL — ABNORMAL HIGH (ref 4.22–5.81)
RDW: 12.9 % (ref 11.5–15.5)
WBC: 5.9 10*3/uL (ref 4.0–10.5)
nRBC: 0 % (ref 0.0–0.2)

## 2023-11-23 LAB — RAPID URINE DRUG SCREEN, HOSP PERFORMED
Amphetamines: NOT DETECTED
Barbiturates: NOT DETECTED
Benzodiazepines: NOT DETECTED
Cocaine: NOT DETECTED
Opiates: NOT DETECTED
Tetrahydrocannabinol: NOT DETECTED

## 2023-11-23 LAB — BASIC METABOLIC PANEL WITH GFR
Anion gap: 12 (ref 5–15)
BUN: 10 mg/dL (ref 6–20)
CO2: 24 mmol/L (ref 22–32)
Calcium: 9.7 mg/dL (ref 8.9–10.3)
Chloride: 101 mmol/L (ref 98–111)
Creatinine, Ser: 1.15 mg/dL (ref 0.61–1.24)
GFR, Estimated: >60 mL/min (ref >60)
Glucose, Bld: 101 mg/dL — ABNORMAL HIGH (ref 70–99)
Potassium: 4.1 mmol/L (ref 3.5–5.1)
Sodium: 137 mmol/L (ref 135–145)

## 2023-11-23 LAB — TROPONIN I (HIGH SENSITIVITY)
Troponin I (High Sensitivity): 2 ng/L (ref <18)
Troponin I (High Sensitivity): 3 ng/L (ref <18)

## 2023-11-23 MED ORDER — AMLODIPINE BESYLATE 5 MG PO TABS: 5.0000 mg | ORAL_TABLET | Freq: Every day | ORAL | 1 refills | Status: DC

## 2023-11-23 MED ORDER — AMLODIPINE BESYLATE 5 MG PO TABS
5.0000 mg | ORAL_TABLET | Freq: Once | ORAL | Status: AC
  Administered 2023-11-23: 5 mg via ORAL
  Filled 2023-11-23: qty 1

## 2023-11-23 MED ORDER — LACTATED RINGERS IV BOLUS
1000.0000 mL | Freq: Once | INTRAVENOUS | Status: AC
  Administered 2023-11-23: 1000 mL via INTRAVENOUS

## 2023-11-23 NOTE — Discharge Instructions (Addendum)

## 2023-11-23 NOTE — ED Provider Notes (Signed)
 [place] EMERGENCY DEPARTMENT AT [facility] Provider Note  CSN: [id number] Arrival date & time: 11/23/23 1523  Chief Complaint(s) Chest Pain  HPI Justin Burch is a 38 y.o. male who is here today for an episode that occurred at work where he began to feel dizzy, felt like he was going to pass out.  He described his vision closing in the corners of his eyes.  Says that he had some fullness over the left side of his chest for several days.  Patient says that his Apple Watch told him that his heart rate decreased to the 40s.   Past Medical History Past Medical History:  Diagnosis Date   Alopecia    Anal skin tag    GERD (gastroesophageal reflux disease)    Hemorrhoids    History of transient ischemic attack (TIA) 2013   no residual per pt--   Syphilis 10/04/2023   Wears glasses    Patient Active Problem List   Diagnosis Date Noted   Right wrist tendinitis 12/01/2019   Closed nondisplaced fracture of head of right radius 09/10/2018   Hair loss 11/16/2016   Residual hemorrhoidal skin tags 11/16/2016   Gastroesophageal reflux disease without esophagitis 11/16/2016   Home Medication(s) Prior to Admission medications   Medication Sig Start Date End Date Taking? Authorizing Provider  cetirizine (ZYRTEC) 10 MG tablet Take 10 mg by mouth daily as needed for allergies.    [provider]  clindamycin  (CLEOCIN ) 300 MG capsule Take 1 capsule (300 mg total) by mouth 4 (four) times daily. X 7 days Patient not taking: Reported on 12/02/2022 05/28/20   Mesner, Reymundo Caulk, MD  emtricitabine -tenofovir  (TRUVADA) 200-300 MG tablet Take 1 tablet by mouth daily.    [provider]  esomeprazole (NEXIUM) 10 MG packet Take 10 mg by mouth daily before breakfast.    [provider]  lisdexamfetamine (VYVANSE ) 40 MG capsule Take 1 capsule (40 mg total) by mouth daily. Patient not taking: Reported on 12/02/2022 02/19/20   Adra Alanis, FNP                                                                                                                                     Past Surgical History Past Surgical History:  Procedure Laterality Date   EXCISION OF SKIN TAG N/A 01/18/2017   Procedure: EXCISION ANAL SKIN TAG, RECTAL EXAM UNDER ANESTHESIA;  Surgeon: Adalberto Acton, MD;  Location: [facility];  Service: General;  Laterality: N/A;   HEMORROIDECTOMY     NO PAST SURGERIES     Family History Family History  Problem Relation Age of Onset   Hypertension Mother    Stroke Mother    Ovarian cancer Mother    Kidney disease Mother    Healthy Father    Lung cancer Maternal Grandmother    Healthy Paternal Grandmother    Colon cancer Paternal Grandfather  Social History Social History   Tobacco Use   Smoking status: Never   Smokeless tobacco: Never  Substance Use Topics   Alcohol use: Yes    Comment: occasional   Drug use: No   Allergies Patient has no known allergies.  Review of Systems Review of Systems  Physical Exam Vital Signs  I have reviewed the triage vital signs BP (!) 179/125   Pulse 77   Temp 98.5 F (36.9 C) (Oral)   Resp 20   Ht 5\' 9"  (1.753 m)   Wt 94 kg   SpO2 99%   BMI 30.60 kg/m   Physical Exam Vitals reviewed.  Constitutional:      Appearance: He is not toxic-appearing.  Cardiovascular:     Heart sounds: Normal heart sounds. No murmur heard. Pulmonary:     Breath sounds: Normal breath sounds.  Chest:     Chest wall: No mass.  Abdominal:     Palpations: Abdomen is soft.  Musculoskeletal:        General: Normal range of motion.  Skin:    General: Skin is warm.  Neurological:     General: No focal deficit present.     Mental Status: He is alert.     ED Results and Treatments Labs (all labs ordered are listed, but only abnormal results are displayed) Labs Reviewed  BASIC METABOLIC PANEL WITH GFR - Abnormal; Notable for the following components:      Result Value   Glucose,  Bld 101 (*)    All other components within normal limits  CBC - Abnormal; Notable for the following components:   RBC 6.08 (*)    Hemoglobin 18.2 (*)    HCT 53.0 (*)    All other components within normal limits  RAPID URINE DRUG SCREEN, HOSP PERFORMED  TROPONIN I (HIGH SENSITIVITY)  TROPONIN I (HIGH SENSITIVITY)                                                                                                                          Radiology DG Chest 2 View Result Date: 11/23/2023 CLINICAL DATA:  Chest pain. Dizziness this morning with bradycardia. EXAM: CHEST - 2 VIEW COMPARISON:  10/06/2023 FINDINGS: The heart size and mediastinal contours are within normal limits. Both lungs are clear. The visualized skeletal structures are unremarkable. IMPRESSION: No active cardiopulmonary disease. Electronically Signed   By: Catherin Closs M.D.   On: 11/23/2023 17:16    Pertinent labs & imaging results that were available during my care of the patient were reviewed by me and considered in my medical decision making (see MDM for details).  Medications Ordered in ED Medications  lactated ringers  bolus 1,000 mL (has no administration in time range)  amLODipine (NORVASC) tablet 5 mg (has no administration in time range)  Procedures Ultrasound ED Echo  Date/Time: 11/23/2023 5:20 PM  Performed by: Nathanael Baker, DO Authorized by: Nathanael Baker, DO   Procedure details:    Indications: chest pain     Views: parasternal long axis view, parasternal short axis view and apical 4 chamber view     Images: archived   Findings:    Pericardium: no pericardial effusion     LV Function: normal (>50% EF)     RV Diameter: normal   Impression:    Impression: normal     (including critical care time)  Medical Decision Making / ED Course   This patient presents to the  ED for concern of near syncopal episode, chest pain, this involves an extensive number of treatment options, and is a complaint that carries with it a high risk of complications and morbidity.  The differential diagnosis includes vasovagal syncope, less likely cardiogenic syncope, less likely PE, less likely ACS, hypertension, anxiety.  MDM:  On exam, patient overall looks well.  He has a normal EKG, he has bedside cardiac ultrasound shows no wall motion abnormalities, no pericardial effusion.  Patient gives a good description of the vasovagal episode, and including the decreased heart rate.  Will check basic labs on the patient, plan for 2 troponins.  Patient noted to be hypertensive, does not currently have a PCP.  Will start the patient on some amlodipine.   Reassessment 6:50 PM-troponin negative x 2.  Patient hemoconcentrated.  Gave a liter of fluid.  Will have the patient focus on hydration, discharged with amlodipine.  Patient encouraged to follow-up with PCP.  Additional history obtained: -Additional history obtained from  -External records from outside source obtained and reviewed including: Chart review including previous notes, labs, imaging, consultation notes   Lab Tests: -I ordered, reviewed, and interpreted labs.   The pertinent results include:   Labs Reviewed  BASIC METABOLIC PANEL WITH GFR - Abnormal; Notable for the following components:      Result Value   Glucose, Bld 101 (*)    All other components within normal limits  CBC - Abnormal; Notable for the following components:   RBC 6.08 (*)    Hemoglobin 18.2 (*)    HCT 53.0 (*)    All other components within normal limits  RAPID URINE DRUG SCREEN, HOSP PERFORMED  TROPONIN I (HIGH SENSITIVITY)  TROPONIN I (HIGH SENSITIVITY)      EKG my independent review of the patient's EKG shows no ST segment depressions or elevations, no T wave inversions, no evidence of acute ischemia.  EKG  Interpretation Date/Time:  Saturday November 23 2023 15:44:04 EDT Ventricular Rate:  77 PR Interval:  168 QRS Duration:  86 QT Interval:  396 QTC Calculation: 448 R Axis:   109  Text Interpretation: Normal sinus rhythm Rightward axis Possible Anterior infarct , age undetermined Abnormal ECG When compared with ECG of 06-Oct-2023 20:21, PREVIOUS ECG IS PRESENT Confirmed by Afton Horse [phone]) on 11/23/2023 3:58:23 PM         Imaging Studies ordered: I ordered imaging studies including chest x-ray I independently visualized and interpreted imaging. I agree with the radiologist interpretation   Medicines ordered and prescription drug management: Meds ordered this encounter  Medications   lactated ringers  bolus 1,000 mL   amLODipine (NORVASC) tablet 5 mg    -I have reviewed the patients home medicines and have made adjustments as needed   Cardiac Monitoring: The patient was maintained on a cardiac monitor.  I personally viewed  and interpreted the cardiac monitored which showed an underlying rhythm of: Normal sinus rhythm  Social Determinants of Health:  Factors impacting patients care include: Lack of access to primary care   Reevaluation: After the interventions noted above, I reevaluated the patient and found that they have :improved  Co morbidities that complicate the patient evaluation  Past Medical History:  Diagnosis Date   Alopecia    Anal skin tag    GERD (gastroesophageal reflux disease)    Hemorrhoids    History of transient ischemic attack (TIA) 2013   no residual per pt--   Syphilis 10/04/2023   Wears glasses       Dispostion: I considered admission for this patient, however with his reassuring workup he is appropriate for outpatient follow-up.     Final Clinical Impression(s) / ED Diagnoses Final diagnoses:  None     @PCDICTATION @    Afton Horse T, DO 11/23/23 1856

## 2023-11-23 NOTE — ED Triage Notes (Signed)
 Pt reports heaviness to middle of chest for a few days. Today, he felt dizzy and near syncopal and his watch showed his HR dropped in 40s. Endorses numbness to left hand and foot. Seen in March for sharp Cp but pain feels different today.

## 2023-11-28 ENCOUNTER — Telehealth: Payer: Self-pay | Admitting: *Deleted

## 2023-11-28 NOTE — Progress Notes (Signed)
 Care Guide Pharmacy Note  11/28/2023 Name: DETRELL LEFEBVRE MRN: [id number] DOB: 05-06-1986  Referred By: Pcp, No Reason for referral: Complex Care Management and Call Attempt #1 (Outreach to schedule referral with pharmacist )   Alita Apt is a 38 y.o. year old male who is a primary care patient of Pcp, No.  MAJIK PREVOT was referred to the pharmacist for assistance related to: HTN  An unsuccessful telephone outreach was attempted today to contact the patient who was referred to the pharmacy team for assistance with medication management. Additional attempts will be made to contact the patient.  Kandis Ormond, CMA   [facility], [facility] Guide Direct Dial: [phone]  Fax: [phone] Website: [email or web address]

## 2023-11-28 NOTE — Progress Notes (Signed)
 Care Guide Pharmacy Note  11/28/2023 Name: Justin Burch MRN: [id number] DOB: August 14, 1985  Referred By: Pcp, No Reason for referral: Complex Care Management and Call Attempt #1 (Outreach to schedule referral with pharmacist )   Justin Burch is a 38 y.o. year old male who is a primary care patient of Pcp, No.  [name] was referred to the pharmacist for assistance related to: HTN  Successful contact was made with the patient to discuss pharmacy services including being ready for the pharmacist to call at least 5 minutes before the scheduled appointment time and to have medication bottles and any blood pressure readings ready for review. The patient agreed to meet with the pharmacist via telephone visit on 12/13/2023  Kandis Ormond, CMA [place]  [facility], [facility] Guide Direct Dial: [phone]  Fax: [phone] Website: [email or web address]

## 2023-12-13 ENCOUNTER — Other Ambulatory Visit: Payer: Self-pay

## 2023-12-13 NOTE — Progress Notes (Signed)
 12/13/2023 Name: IZIAHA MAHOOD MRN: [id number] DOB: 01/08/1986  Chief Complaint  Patient presents with   Medication Management   Hypertension    ODAY LOTHIAN is a 38 y.o. year old male who presented for a telephone visit.   They were referred to the pharmacist by ED for assistance in managing hypertension.    Subjective:  Care Team: Primary Care Provider: Does not have one at time of this appt  Medication Access/Adherence  Current Pharmacy:  [id number] - [place], [place] - [address] AT [facility] OF [address] & [facility] [address] [place] [address] Phone: [phone] Fax: [phone]  [facility]) [phone] - [place], [place] - [address] AT [facility] [address] [place] [address] Phone: [phone] Fax: [phone]   Patient reports affordability concerns with their medications: No insurance, paying for rx on discount card Patient reports access/transportation concerns to their pharmacy: No  Patient reports adherence concerns with their medications:  No     Hypertension:  Current medications: Amlodipine  5mg  daily Medications previously tried: None  Patient has a validated, automated, upper arm home BP cuff Current blood pressure readings readings: 134/81 yesterday, staying in mid 130s/low 80s the last week  Patient denies hypotensive s/sx including dizziness, lightheadedness.  Patient denies hypertensive symptoms including headache, chest pain, shortness of breath  Does not have insurance and reports he needs a PCP   Objective:  No results found for: "HGBA1C"  Lab Results  Component Value Date   CREATININE 1.15 11/23/2023   BUN 10 11/23/2023   NA 137 11/23/2023   K 4.1 11/23/2023   CL 101 11/23/2023   CO2 24 11/23/2023    Lab Results  Component Value Date   CHOL 132 06/12/2018   HDL 32.40 (L) 06/12/2018   LDLCALC 78 06/12/2018   TRIG 108.0 06/12/2018   CHOLHDL 4  06/12/2018    Medications Reviewed Today     Reviewed by Carnell Christian, RPH (Pharmacist) on 12/13/23 at 1018  Med List Status: <None>   Medication Order Taking? Sig Documenting Provider Last Dose Status Informant  amLODipine  (NORVASC ) 5 MG tablet [id number] Yes Take 1 tablet (5 mg total) by mouth daily. Nathanael Baker, DO Taking Active   cetirizine (ZYRTEC) 10 MG tablet [id number] No Take 10 mg by mouth daily as needed for allergies.  Patient not taking: Reported on 12/13/2023   [provider] Not Taking Active Self, Pharmacy Records  emtricitabine -tenofovir  (TRUVADA) 200-300 MG tablet [id number] No Take 1 tablet by mouth daily.  Patient not taking: Reported on 12/13/2023   [provider] Not Taking Active Self, Pharmacy Records  esomeprazole (NEXIUM) 10 MG packet [id number]  Take 10 mg by mouth daily before breakfast. [provider]  Active Self, Pharmacy Records  lisdexamfetamine (VYVANSE ) 40 MG capsule [id number]  Take 1 capsule (40 mg total) by mouth daily.  Patient not taking: Reported on 12/02/2022   Adra Alanis, FNP  Active Self, Pharmacy Records              Assessment/Plan:   Hypertension: - Currently uncontrolled - Reviewed long term cardiovascular and renal outcomes of uncontrolled blood pressure - Reviewed appropriate blood pressure monitoring technique and reviewed goal blood pressure. Recommended to check home blood pressure and heart rate daily - Recommend to continue current medication therapy, could consider increasing amlodipine  to 10mg  at next appt to get BP <130/80 if still elevated at that time -Setup PCP  [facility]16/[facility] -Reviewed [facility] and discount drug list with patient. Explained how to get rx filled there, patient expressed understanding and plans to do so     Follow Up Plan: 01/16/24 with new PCP  Carnell Christian, PharmD Clinical Pharmacist [phone]

## 2024-01-16 ENCOUNTER — Ambulatory Visit (INDEPENDENT_AMBULATORY_CARE_PROVIDER_SITE_OTHER): Payer: Self-pay | Admitting: Nurse Practitioner

## 2024-01-16 ENCOUNTER — Encounter: Payer: Self-pay | Admitting: Nurse Practitioner

## 2024-01-16 ENCOUNTER — Other Ambulatory Visit (HOSPITAL_COMMUNITY): Payer: Self-pay

## 2024-01-16 VITALS — BP 139/76 | HR 73 | Temp 98.6°F | Wt 208.8 lb

## 2024-01-16 DIAGNOSIS — Z1329 Encounter for screening for other suspected endocrine disorder: Secondary | ICD-10-CM

## 2024-01-16 DIAGNOSIS — Z13220 Encounter for screening for lipoid disorders: Secondary | ICD-10-CM

## 2024-01-16 DIAGNOSIS — I10 Essential (primary) hypertension: Secondary | ICD-10-CM

## 2024-01-16 MED ORDER — AMLODIPINE BESYLATE 5 MG PO TABS
5.0000 mg | ORAL_TABLET | Freq: Every day | ORAL | 1 refills | Status: DC
  Filled 2024-01-16 (×2): qty 30, 30d supply, fill #0
  Filled 2024-02-12: qty 30, 30d supply, fill #1

## 2024-01-16 MED ORDER — AMLODIPINE BESYLATE 5 MG PO TABS
5.0000 mg | ORAL_TABLET | Freq: Every day | ORAL | 1 refills | Status: DC
  Filled 2024-01-16: qty 30, 30d supply, fill #0

## 2024-01-16 NOTE — Patient Instructions (Signed)
 1. Primary hypertension (Primary)  - amLODipine  (NORVASC ) 5 MG tablet; Take 1 tablet (5 mg total) by mouth daily.  Dispense: 30 tablet; Refill: 1  2. Lipid screening  - Lipid Panel; Future  3. Thyroid  disorder screen  - TSH; Future  DASH Eating Plan DASH stands for Dietary Approaches to Stop Hypertension. The DASH eating plan is a healthy eating plan that has been shown to: Lower high blood pressure (hypertension). Reduce your risk for type 2 diabetes, heart disease, and stroke. Help with weight loss. What are tips for following this plan? Reading food labels Check food labels for the amount of salt (sodium) per serving. Choose foods with less than 5 percent of the Daily Value (DV) of sodium. In general, foods with less than 300 milligrams (mg) of sodium per serving fit into this eating plan. To find whole grains, look for the word whole as the first word in the ingredient list. Shopping Buy products labeled as low-sodium or no salt added. Buy fresh foods. Avoid canned foods and pre-made or frozen meals. Cooking Try not to add salt when you cook. Use salt-free seasonings or herbs instead of table salt or sea salt. Check with your health care provider or pharmacist before using salt substitutes. Do not fry foods. Cook foods in healthy ways, such as baking, boiling, grilling, roasting, or broiling. Cook using oils that are good for your heart. These include olive, canola, avocado, soybean, and sunflower oil. Meal planning  Eat a balanced diet. This should include: 4 or more servings of fruits and 4 or more servings of vegetables each day. Try to fill half of your plate with fruits and vegetables. 6-8 servings of whole grains each day. 6 or less servings of lean meat, poultry, or fish each day. 1 oz is 1 serving. A 3 oz (85 g) serving of meat is about the same size as the palm of your hand. One egg is 1 oz (28 g). 2-3 servings of low-fat dairy each day. One serving is 1 cup  (237 mL). 1 serving of nuts, seeds, or beans 5 times each week. 2-3 servings of heart-healthy fats. Healthy fats called omega-3 fatty acids are found in foods such as walnuts, flaxseeds, fortified milks, and eggs. These fats are also found in cold-water fish, such as sardines, salmon, and mackerel. Limit how much you eat of: Canned or prepackaged foods. Food that is high in trans fat, such as fried foods. Food that is high in saturated fat, such as fatty meat. Desserts and other sweets, sugary drinks, and other foods with added sugar. Full-fat dairy products. Do not salt foods before eating. Do not eat more than 4 egg yolks a week. Try to eat at least 2 vegetarian meals a week. Eat more home-cooked food and less restaurant, buffet, and fast food. Lifestyle When eating at a restaurant, ask if your food can be made with less salt or no salt. If you drink alcohol: Limit how much you have to: 0-1 drink a day if you are male. 0-2 drinks a day if you are male. Know how much alcohol is in your drink. In the [place] one drink is one 12 oz bottle of beer (355 mL), one 5 oz glass of wine (148 mL), or one 1 oz glass of hard liquor (44 mL). General information Avoid eating more than 2,300 mg of salt a day. If you have hypertension, you may need to reduce your sodium intake to 1,500 mg a day. Work with your provider  to stay at a healthy body weight or lose weight. Ask what the best weight range is for you. On most days of the week, get at least 30 minutes of exercise that causes your heart to beat faster. This may include walking, swimming, or biking. Work with your provider or dietitian to adjust your eating plan to meet your specific calorie needs. What foods should I eat? Fruits All fresh, dried, or frozen fruit. Canned fruits that are in their natural juice and do not have sugar added to them. Vegetables Fresh or frozen vegetables that are raw, steamed, roasted, or grilled. Low-sodium or  reduced-sodium tomato and vegetable juice. Low-sodium or reduced-sodium tomato sauce and tomato paste. Low-sodium or reduced-sodium canned vegetables. Grains Whole-grain or whole-wheat bread. Whole-grain or whole-wheat pasta. Brown rice. Dwyane Glad. Bulgur. Whole-grain and low-sodium cereals. Pita bread. Low-fat, low-sodium crackers. Whole-wheat flour tortillas. Meats and other proteins Skinless chicken or [place]. Ground chicken or [place]. Pork with fat trimmed off. Fish and seafood. Egg whites. Dried beans, peas, or lentils. Unsalted nuts, nut butters, and seeds. Unsalted canned beans. Lean cuts of beef with fat trimmed off. Low-sodium, lean precooked or cured meat, such as sausages or meat loaves. Dairy Low-fat (1%) or fat-free (skim) milk. Reduced-fat, low-fat, or fat-free cheeses. Nonfat, low-sodium ricotta or cottage cheese. Low-fat or nonfat yogurt. Low-fat, low-sodium cheese. Fats and oils Soft margarine without trans fats. Vegetable oil. Reduced-fat, low-fat, or light mayonnaise and salad dressings (reduced-sodium). Canola, safflower, olive, avocado, soybean, and sunflower oils. Avocado. Seasonings and condiments Herbs. Spices. Seasoning mixes without salt. Other foods Unsalted popcorn and pretzels. Fat-free sweets. The items listed above may not be all the foods and drinks you can have. Talk to a dietitian to learn more. What foods should I avoid? Fruits Canned fruit in a light or heavy syrup. Fried fruit. Fruit in cream or butter sauce. Vegetables Creamed or fried vegetables. Vegetables in a cheese sauce. Regular canned vegetables that are not marked as low-sodium or reduced-sodium. Regular canned tomato sauce and paste that are not marked as low-sodium or reduced-sodium. Regular tomato and vegetable juices that are not marked as low-sodium or reduced-sodium. Vanessa General. Olives. Grains Baked goods made with fat, such as croissants, muffins, or some breads. Dry pasta or rice meal  packs. Meats and other proteins Fatty cuts of meat. Ribs. Fried meat. Helene Loader. Bologna, salami, and other precooked or cured meats, such as sausages or meat loaves, that are not lean and low in sodium. Fat from the back of a pig (fatback). Bratwurst. Salted nuts and seeds. Canned beans with added salt. Canned or smoked fish. Whole eggs or egg yolks. Chicken or [place] with skin. Dairy Whole or 2% milk, cream, and half-and-half. Whole or full-fat cream cheese. Whole-fat or sweetened yogurt. Full-fat cheese. Nondairy creamers. Whipped toppings. Processed cheese and cheese spreads. Fats and oils Butter. Stick margarine. Lard. Shortening. Ghee. Bacon fat. Tropical oils, such as coconut, palm kernel, or palm oil. Seasonings and condiments Onion salt, garlic salt, seasoned salt, table salt, and sea salt. Worcestershire sauce. Tartar sauce. Barbecue sauce. Teriyaki sauce. Soy sauce, including reduced-sodium soy sauce. Steak sauce. Canned and packaged gravies. Fish sauce. Oyster sauce. Cocktail sauce. Store-bought horseradish. Ketchup. Mustard. Meat flavorings and tenderizers. Bouillon cubes. Hot sauces. Pre-made or packaged marinades. Pre-made or packaged taco seasonings. Relishes. Regular salad dressings. Other foods Salted popcorn and pretzels. The items listed above may not be all the foods and drinks you should avoid. Talk to a dietitian to learn more. Where to  find more information National Heart, Lung, and Blood Institute (NHLBI): BuffaloDryCleaner.gl American Heart Association (AHA): heart.org Academy of Nutrition and Dietetics: eatright.org National Kidney Foundation (NKF): kidney.org This information is not intended to replace advice given to you by your health care provider. Make sure you discuss any questions you have with your health care provider. Document Revised: 08/02/2022 Document Reviewed: 08/02/2022 Elsevier Patient Education  2024 [employer].

## 2024-01-16 NOTE — Progress Notes (Signed)
 Subjective   Patient ID: Justin Burch, male    DOB: 01-10-1986, 38 y.o.   MRN: [id number]  Chief Complaint  Patient presents with   Establish Care    Patient stated that he needs refills on his Amlodipine      Referring provider: No ref. provider found  Justin Burch is a 38 y.o. male with Past Medical History: No date: Alopecia No date: Anal skin tag No date: GERD (gastroesophageal reflux disease) No date: Hemorrhoids 2013: History of transient ischemic attack (TIA)     Comment:  no residual per pt-- No date: Hypertension 10/04/2023: Syphilis No date: Wears glasses  HPI  Patient presents today to establish care.  He was recently seen in the ED for hypertensive urgency.  Overall he has been healthy with no significant health history.  States that he was just recently diagnosed with hypertension.  He has been compliant with amlodipine  5 mg.  He is trying to cut back on salt in diet and does stay active.  Blood pressures have been stable.  He does have slight peripheral edema at times but does work long hours on his feet.  He will return tomorrow for fasting labs for lipid panel and thyroid . Denies f/c/s, n/v/d, hemoptysis, PND, leg swelling Denies chest pain or edema     No Known Allergies  Immunization History  Administered Date(s) Administered   Influenza-Unspecified 06/12/2018   PFIZER(Purple Top)SARS-COV-2 Vaccination 10/04/2019, 11/03/2019    Tobacco History: Social History   Tobacco Use  Smoking Status Never  Smokeless Tobacco Never   Counseling given: Not Answered   Outpatient Encounter Medications as of 01/16/2024  Medication Sig   cetirizine (ZYRTEC) 10 MG tablet Take 10 mg by mouth daily as needed for allergies.   esomeprazole (NEXIUM) 10 MG packet Take 10 mg by mouth daily before breakfast.   [DISCONTINUED] amLODipine  (NORVASC ) 5 MG tablet Take 1 tablet (5 mg total) by mouth daily.   amLODipine  (NORVASC ) 5 MG tablet Take 1 tablet (5 mg total) by  mouth daily.   emtricitabine -tenofovir  (TRUVADA) 200-300 MG tablet Take 1 tablet by mouth daily. (Patient not taking: Reported on 12/13/2023)   lisdexamfetamine (VYVANSE ) 40 MG capsule Take 1 capsule (40 mg total) by mouth daily. (Patient not taking: Reported on 12/02/2022)   [DISCONTINUED] amLODipine  (NORVASC ) 5 MG tablet Take 1 tablet (5 mg total) by mouth daily.   No facility-administered encounter medications on file as of 01/16/2024.    Review of Systems  Review of Systems  Constitutional: Negative.   HENT: Negative.    Cardiovascular: Negative.   Gastrointestinal: Negative.   Allergic/Immunologic: Negative.   Neurological: Negative.   Psychiatric/Behavioral: Negative.       Objective:   BP 139/76   Pulse 73   Temp 98.6 F (37 C) (Oral)   Wt 208 lb 12.8 oz (94.7 kg)   SpO2 98%   BMI 30.83 kg/m   Wt Readings from Last 5 Encounters:  01/16/24 208 lb 12.8 oz (94.7 kg)  11/23/23 207 lb 3.7 oz (94 kg)  10/06/23 207 lb (93.9 kg)  12/02/22 190 lb (86.2 kg)  05/27/20 195 lb (88.5 kg)     Physical Exam Vitals and nursing note reviewed.  Constitutional:      General: He is not in acute distress.    Appearance: He is well-developed.   Cardiovascular:     Rate and Rhythm: Normal rate and regular rhythm.  Pulmonary:     Effort: Pulmonary effort is normal.  Breath sounds: Normal breath sounds.   Skin:    General: Skin is warm and dry.   Neurological:     Mental Status: He is alert and oriented to person, place, and time.       Assessment & Plan:   Primary hypertension -     amLODIPine  Besylate; Take 1 tablet (5 mg total) by mouth daily.  Dispense: 30 tablet; Refill: 1  Lipid screening -     Lipid panel; Future  Thyroid  disorder screen -     TSH; Future     Return in about 6 months (around 07/17/2024).   Jerrlyn Morel, NP 01/16/2024

## 2024-01-17 ENCOUNTER — Other Ambulatory Visit: Payer: Self-pay

## 2024-01-17 DIAGNOSIS — Z1329 Encounter for screening for other suspected endocrine disorder: Secondary | ICD-10-CM

## 2024-01-17 DIAGNOSIS — Z13220 Encounter for screening for lipoid disorders: Secondary | ICD-10-CM

## 2024-01-18 LAB — LIPID PANEL
Chol/HDL Ratio: 4.4 ratio (ref 0.0–5.0)
Cholesterol, Total: 181 mg/dL (ref 100–199)
HDL: 41 mg/dL (ref >39)
LDL Chol Calc (NIH): 117 mg/dL — ABNORMAL HIGH (ref 0–99)
Triglycerides: 125 mg/dL (ref 0–149)
VLDL Cholesterol Cal: 23 mg/dL (ref 5–40)

## 2024-01-18 LAB — TSH: TSH: 3.02 u[IU]/mL (ref 0.450–4.500)

## 2024-01-19 ENCOUNTER — Ambulatory Visit: Payer: Self-pay | Admitting: Nurse Practitioner

## 2024-02-12 ENCOUNTER — Other Ambulatory Visit (HOSPITAL_BASED_OUTPATIENT_CLINIC_OR_DEPARTMENT_OTHER): Payer: Self-pay

## 2024-02-14 ENCOUNTER — Other Ambulatory Visit (HOSPITAL_COMMUNITY): Payer: Self-pay

## 2024-03-08 ENCOUNTER — Other Ambulatory Visit: Payer: Self-pay | Admitting: Nurse Practitioner

## 2024-03-08 DIAGNOSIS — I10 Essential (primary) hypertension: Secondary | ICD-10-CM

## 2024-03-09 ENCOUNTER — Other Ambulatory Visit (HOSPITAL_COMMUNITY): Payer: Self-pay

## 2024-03-09 MED ORDER — AMLODIPINE BESYLATE 5 MG PO TABS
5.0000 mg | ORAL_TABLET | Freq: Every day | ORAL | 0 refills | Status: DC
  Filled 2024-03-09: qty 90, 90d supply, fill #0

## 2024-03-31 ENCOUNTER — Other Ambulatory Visit: Payer: Self-pay

## 2024-05-26 ENCOUNTER — Other Ambulatory Visit: Payer: Self-pay | Admitting: Nurse Practitioner

## 2024-05-26 DIAGNOSIS — I10 Essential (primary) hypertension: Secondary | ICD-10-CM

## 2024-05-28 ENCOUNTER — Other Ambulatory Visit (HOSPITAL_COMMUNITY): Payer: Self-pay

## 2024-05-28 MED ORDER — AMLODIPINE BESYLATE 5 MG PO TABS
5.0000 mg | ORAL_TABLET | Freq: Every day | ORAL | 0 refills | Status: DC
  Filled 2024-05-28: qty 90, 90d supply, fill #0

## 2024-07-17 ENCOUNTER — Other Ambulatory Visit (HOSPITAL_COMMUNITY): Payer: Self-pay

## 2024-07-17 ENCOUNTER — Ambulatory Visit: Payer: Self-pay | Admitting: Nurse Practitioner

## 2024-07-17 VITALS — BP 148/98 | HR 78 | Wt 221.0 lb

## 2024-07-17 DIAGNOSIS — Z13220 Encounter for screening for lipoid disorders: Secondary | ICD-10-CM

## 2024-07-17 DIAGNOSIS — I10 Essential (primary) hypertension: Secondary | ICD-10-CM

## 2024-07-17 MED ORDER — AMLODIPINE BESYLATE 10 MG PO TABS
10.0000 mg | ORAL_TABLET | Freq: Every day | ORAL | 2 refills | Status: AC
  Filled 2024-07-17 – 2024-08-08 (×2): qty 90, 90d supply, fill #0

## 2024-07-17 NOTE — Progress Notes (Signed)
 "  Subjective   Patient ID: Justin Burch, male    DOB: 1986/01/18, 38 y.o.   MRN: [id number]  Chief Complaint  Patient presents with   Hypertension    Referring provider: No ref. provider found  ESGAR BARNICK is a 39 y.o. male with Past Medical History: No date: Alopecia No date: Anal skin tag No date: GERD (gastroesophageal reflux disease) No date: Hemorrhoids 2013: History of transient ischemic attack (TIA)     Comment:  no residual per pt-- No date: Hypertension 10/04/2023: Syphilis No date: Wears glasses   HPI  Patient presents today for a follow-up on hypertension.  He has not been checking blood pressures at home.  Blood pressure was slightly elevated in office today.  We will increase amlodipine  to 10 mg daily.  Patient states that he will do a better job checking his blood pressures at home.  He has been trying to limit salt in his diet.  No new issues or concerns today.  We will check labs today. Denies f/c/s, n/v/d, hemoptysis, PND, leg swelling Denies chest pain or edema     Allergies[1]  Immunization History  Administered Date(s) Administered   Influenza-Unspecified 06/12/2018   PFIZER(Purple Top)SARS-COV-2 Vaccination 10/04/2019, 11/03/2019    Tobacco History: Tobacco Use History[2] Counseling given: Not Answered   Outpatient Encounter Medications as of 07/17/2024  Medication Sig   amLODipine  (NORVASC ) 10 MG tablet Take 1 tablet (10 mg total) by mouth daily.   cetirizine (ZYRTEC) 10 MG tablet Take 10 mg by mouth daily as needed for allergies.   esomeprazole (NEXIUM) 10 MG packet Take 10 mg by mouth daily before breakfast.   [DISCONTINUED] amLODipine  (NORVASC ) 5 MG tablet Take 1 tablet (5 mg total) by mouth daily.   emtricitabine -tenofovir  (TRUVADA) 200-300 MG tablet Take 1 tablet by mouth daily. (Patient not taking: Reported on 12/13/2023)   lisdexamfetamine  (VYVANSE ) 40 MG capsule Take 1 capsule (40 mg total) by mouth daily. (Patient not taking:  Reported on 12/02/2022)   No facility-administered encounter medications on file as of 07/17/2024.    Review of Systems  Review of Systems  Constitutional: Negative.   HENT: Negative.    Cardiovascular: Negative.   Gastrointestinal: Negative.   Allergic/Immunologic: Negative.   Neurological: Negative.   Psychiatric/Behavioral: Negative.       Objective:   BP (!) 148/98 (BP Location: Left Arm, Patient Position: Sitting, Cuff Size: Large)   Pulse 78   Wt 221 lb (100.2 kg)   SpO2 97%   BMI 32.64 kg/m   Wt Readings from Last 5 Encounters:  07/17/24 221 lb (100.2 kg)  01/16/24 208 lb 12.8 oz (94.7 kg)  11/23/23 207 lb 3.7 oz (94 kg)  10/06/23 207 lb (93.9 kg)  12/02/22 190 lb (86.2 kg)     Physical Exam Vitals and nursing note reviewed.  Constitutional:      General: He is not in acute distress.    Appearance: He is well-developed.  Cardiovascular:     Rate and Rhythm: Normal rate and regular rhythm.  Pulmonary:     Effort: Pulmonary effort is normal.     Breath sounds: Normal breath sounds.  Skin:    General: Skin is warm and dry.  Neurological:     Mental Status: He is alert and oriented to person, place, and time.       Assessment & Plan:   Primary hypertension -     CBC -     Comprehensive metabolic panel with GFR -  amLODIPine  Besylate; Take 1 tablet (10 mg total) by mouth daily.  Dispense: 90 tablet; Refill: 2  Lipid screening -     Lipid panel     Return in about 4 weeks (around 08/14/2024) for hypertension.   Bascom GORMAN Borer, NP 07/17/2024     [1] No Known Allergies [2]  Social History Tobacco Use  Smoking Status Never  Smokeless Tobacco Never   "

## 2024-07-18 LAB — CBC
Hematocrit: 50.8 % (ref 37.5–51.0)
Hemoglobin: 16.9 g/dL (ref 13.0–17.7)
MCH: 29.8 pg (ref 26.6–33.0)
MCHC: 33.3 g/dL (ref 31.5–35.7)
MCV: 89 fL (ref 79–97)
Platelets: 275 x10E3/uL (ref 150–450)
RBC: 5.68 x10E6/uL (ref 4.14–5.80)
RDW: 12.7 % (ref 11.6–15.4)
WBC: 7.1 x10E3/uL (ref 3.4–10.8)

## 2024-07-18 LAB — LIPID PANEL
Chol/HDL Ratio: 5.2 ratio — ABNORMAL HIGH (ref 0.0–5.0)
Cholesterol, Total: 233 mg/dL — ABNORMAL HIGH (ref 100–199)
HDL: 45 mg/dL
LDL Chol Calc (NIH): 156 mg/dL — ABNORMAL HIGH (ref 0–99)
Triglycerides: 177 mg/dL — ABNORMAL HIGH (ref 0–149)
VLDL Cholesterol Cal: 32 mg/dL (ref 5–40)

## 2024-07-18 LAB — COMPREHENSIVE METABOLIC PANEL WITH GFR
ALT: 44 IU/L (ref 0–44)
AST: 32 IU/L (ref 0–40)
Albumin: 4.6 g/dL (ref 4.1–5.1)
Alkaline Phosphatase: 96 IU/L (ref 47–123)
BUN/Creatinine Ratio: 10 (ref 9–20)
BUN: 11 mg/dL (ref 6–20)
Bilirubin Total: 0.6 mg/dL (ref 0.0–1.2)
CO2: 26 mmol/L (ref 20–29)
Calcium: 9.4 mg/dL (ref 8.7–10.2)
Chloride: 98 mmol/L (ref 96–106)
Creatinine, Ser: 1.12 mg/dL (ref 0.76–1.27)
Globulin, Total: 3.2 g/dL (ref 1.5–4.5)
Glucose: 86 mg/dL (ref 70–99)
Potassium: 4.6 mmol/L (ref 3.5–5.2)
Sodium: 137 mmol/L (ref 134–144)
Total Protein: 7.8 g/dL (ref 6.0–8.5)
eGFR: 87 mL/min/1.73

## 2024-07-24 ENCOUNTER — Ambulatory Visit: Payer: Self-pay | Admitting: Nurse Practitioner

## 2024-07-27 ENCOUNTER — Other Ambulatory Visit (HOSPITAL_COMMUNITY): Payer: Self-pay

## 2024-08-08 ENCOUNTER — Other Ambulatory Visit (HOSPITAL_COMMUNITY): Payer: Self-pay

## 2024-08-14 ENCOUNTER — Ambulatory Visit: Payer: Self-pay | Admitting: Nurse Practitioner

## 2024-09-11 ENCOUNTER — Ambulatory Visit: Payer: Self-pay | Admitting: Nurse Practitioner
